# Patient Record
Sex: Female | Born: 1970 | Race: Black or African American | Hispanic: No | Smoking: Never smoker
Health system: Southern US, Community
[De-identification: ages and names within clinical notes are randomized; demographics above are authoritative.]

## PROBLEM LIST (undated history)

## (undated) DIAGNOSIS — K635 Polyp of colon: Secondary | ICD-10-CM

## (undated) DIAGNOSIS — E538 Deficiency of other specified B group vitamins: Secondary | ICD-10-CM

## (undated) DIAGNOSIS — I10 Essential (primary) hypertension: Secondary | ICD-10-CM

## (undated) DIAGNOSIS — I509 Heart failure, unspecified: Secondary | ICD-10-CM

## (undated) DIAGNOSIS — E119 Type 2 diabetes mellitus without complications: Secondary | ICD-10-CM

## (undated) DIAGNOSIS — I2510 Atherosclerotic heart disease of native coronary artery without angina pectoris: Secondary | ICD-10-CM

## (undated) DIAGNOSIS — N96 Recurrent pregnancy loss: Secondary | ICD-10-CM

## (undated) DIAGNOSIS — N920 Excessive and frequent menstruation with regular cycle: Secondary | ICD-10-CM

## (undated) DIAGNOSIS — I6529 Occlusion and stenosis of unspecified carotid artery: Secondary | ICD-10-CM

## (undated) HISTORY — PX: CORONARY ARTERY BYPASS GRAFT: SHX141

## (undated) HISTORY — PX: COLONOSCOPY: SHX174

## (undated) HISTORY — PX: ESOPHAGOGASTRODUODENOSCOPY: SHX1529

---

## 2019-10-19 ENCOUNTER — Other Ambulatory Visit: Payer: Self-pay

## 2019-10-19 ENCOUNTER — Other Ambulatory Visit: Payer: Self-pay | Admitting: Gastroenterology

## 2019-10-19 ENCOUNTER — Ambulatory Visit
Admission: RE | Admit: 2019-10-19 | Discharge: 2019-10-19 | Disposition: A | Discharge status: Home / Self Care | Payer: Medicare Other | Attending: Gastroenterology | Admitting: Gastroenterology

## 2019-10-19 ENCOUNTER — Ambulatory Visit
Admission: RE | Admit: 2019-10-19 | Discharge: 2019-10-19 | Disposition: A | Discharge status: Home / Self Care | Payer: Medicare Other | Source: Ambulatory Visit | Attending: Gastroenterology | Admitting: Gastroenterology

## 2019-10-19 DIAGNOSIS — K5909 Other constipation: Secondary | ICD-10-CM | POA: Insufficient documentation

## 2020-03-29 ENCOUNTER — Ambulatory Visit: Payer: Medicare Other

## 2020-04-02 ENCOUNTER — Other Ambulatory Visit: Payer: Self-pay

## 2020-04-02 ENCOUNTER — Ambulatory Visit: Payer: Medicare Other | Attending: Obstetrics and Gynecology | Admitting: Physical Therapy

## 2020-04-02 ENCOUNTER — Encounter: Payer: Self-pay | Admitting: Physical Therapy

## 2020-04-02 VITALS — BP 114/72 | HR 102

## 2020-04-02 DIAGNOSIS — R278 Other lack of coordination: Secondary | ICD-10-CM | POA: Diagnosis present

## 2020-04-02 DIAGNOSIS — M6281 Muscle weakness (generalized): Secondary | ICD-10-CM | POA: Insufficient documentation

## 2020-04-02 DIAGNOSIS — R293 Abnormal posture: Secondary | ICD-10-CM | POA: Insufficient documentation

## 2020-04-02 NOTE — Therapy (Signed)
[facility] [address] [place], [place], [address] Phone: [phone]   Fax:  [phone]  Physical Therapy Evaluation  Patient Details  Name: Stephanie Frye MRN: [id number] Date of Birth: 09-01-1971 Referring Provider (PT): Christeen Douglas   Encounter Date: 04/02/2020  PT End of Session - 04/02/20 0809    Visit Number  1    Number of Visits  12    Date for PT Re-Evaluation  06/25/20    Authorization Type  IE 04/02/2020    PT Start Time  0810    PT Stop Time  0900    PT Time Calculation (min)  50 min    Activity Tolerance  Patient tolerated treatment well    Behavior During Therapy  [facility] for tasks assessed/performed       History reviewed. No pertinent past medical history.  History reviewed. No pertinent surgical history.  Vitals:   04/02/20 0827  BP: 114/72  Pulse: (!) 102         [facility] PT Assessment - 04/02/20 0001      Assessment   Medical Diagnosis  chronic constipation    Referring Provider (PT)  Christeen Douglas        PELVIC HEALTH PHYSICAL THERAPY EVALUATION  SCREENING Red Flags: None Have you had any night sweats? Unexplained weight loss? Saddle anesthesia? Unexplained changes in bowel or bladder habits?  Precautions: extensive cardiac hx  SUBJECTIVE  Chief Complaint: Patient notes this constipation issue started in 2016. Patient notes increased constipation since that time. She reports that she has a small pelvis and feels this is part of the problem as well as her caridac hx and medications she takes to maintain her health/wellbeing. She reports straining with BMs despite an urge to empty. She denies any issues or urinary concerns. Patient notes some nervousness about her appointment today as she is unsure of what pelvic floor physical therapy involves.  Pertinent History:  Falls Positive for fall. Ambulates with SPC typically because of feelings of unsteadiness. Scoliosis Negative. Pulmonary  disease/dysfunction Negative. Surgical history: Positive for (per MD note: . COLONOSCOPY 2018  hemorrhoids  . 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 PERCUTANEOUS PLACEMENT INTRAVASCULAR STENT CERVICAL CAROTID ARTERY 2018  . PERCUTANEOUS TRANSCATH PLACEMENT INTRAVASCULAR STENT LEG 2018  . UPPER GASTROINTESTINAL ENDOSCOPY 2018)  Recent Procedures/Tests/Findings: imaging of abdomen showed the following: "1. No suggestion of obstruction or perforation. 2. Nonspecific air-fluid levels in the colon."  Obstetrical History: G8P2 (x1 lost at 3 days, x1 lost at 6 months) 2009, 2011 Deliveries: vaginal Tearing/Episiotomy: x2 episiotomy; pushing for 20 hours Birthing position: back  Gynecological History: Hysterectomy: No Vaginal/Abdominal Endometriosis: Negative Pain with exam: No   Urinary History: Incontinence: Negative. Nocturia: 2x/night Frequency of urination: every 2 hours Pain with urination: Negative Difficulty initiating urination: Negative Frequent UTI: Negative.   Gastrointestinal History: Bristol Stool Chart: Type 1 and 7 Frequency of BMs: 1x/day with medication Pain with defecation: Positive for prior to BM Straining with defecation: Positive Incontinence: Negative. Patient notes good consumption of fruits, vegetables, and fibers to assist in her BM.  Sexual activity/pain: Pain with intercourse: Positive.   Initial penetration: Yes  Deep thrustingYes   Location of pain: no pain Current pain:  0/10    Patient assessment of present state: "It feels like gas"  Patient Goals:  Relieve constipation; strengthen hips and glutes   OBJECTIVE  Mental Status Patient is oriented  to person, place and time.  Recent memory is intact.  Remote memory is intact.  Attention span and concentration are intact.  Expressive speech is intact.  Patient's fund of knowledge is within normal limits for educational  level.  POSTURE/OBSERVATIONS:  Lumbar lordosis: WNL Rounded thoracic spine and forward shoulders commensurate with extensive sternal surgical hx.    GAIT: Grossly within functional limits. Trendelenburg R: Positive for mild L: Positive for mild  RANGE OF MOTION: deferred 2/2 to time constraints   LEFT RIGHT  Lumbar forward flexion (65):      Lumbar extension (30):     Lumbar lateral flexion (25):     Thoracic and Lumbar rotation (30 degrees):       Hip Flexion (0-125):      Hip IR (0-45):     Hip ER (0-45):     Hip Abduction (0-40):     Hip extension (0-15):       SENSATION: Grossly intact to light touch bilateral LEs as determined by testing dermatomes L2-S2 Proprioception and hot/cold testing deferred on this date  STRENGTH: MMT deferred 2/2 to time constraints  RLE LLE  Hip Flexion    Hip Extension    Hip Abduction     Hip Adduction     Hip ER     Hip IR     Knee Extension    Knee Flexion    Dorsiflexion     Plantarflexion (seated)     ABDOMINAL: deferred 2/2 to time constraints Palpation: Diastasis: Scar mobility: Rib flare:  SPECIAL TESTS: deferred 2/2 to time constraints SLR (SN 92, -LR 0.29):  Lumbar quadrant (SN 70):  FABER (SN 81):  FADIR (SN 94):  Stork/March (SP 93):   PHYSICAL PERFORMANCE MEASURES:  STS: WFL   EXTERNAL PELVIC EXAM:  deferred 2/2 to time constraints Palpation: Breath coordination: Cued Lengthen: Cued Contraction: Cough:  INTERNAL VAGINAL EXAM: deferred 2/2 to time constraints Introitus Appears:  Skin integrity:  Scar mobility: Strength (PERF):  Symmetry: Palpation: Prolapse:   INTERNAL RECTAL EXAM: deferred 2/2 to time constraints Strength (PERF): Symmetry: Palpation: Prolapse:   OUTCOME MEASURES: FOTO (Bowel Constipation: 30)   ASSESSMENT Patient is a 49 year old presenting to clinic with chief complaints of persistent constipation and straining with BM. Upon examination, patient demonstrates deficits  in PFM extensibility, PFM coordination, posture, balance as evidenced by reliance on manually assisted BMs, Type 1 and Type 7 stools (Bristol Stool Chart) SPC for gait/balance, and increased thoracic kyphosis. Patient's responses on FOTO outcome measures (30) indicate moderate functional limitations/disability/distress. Patient's progress may be limited due to comorbidities; however, patient's motivation is advantageous. Patient was able to achieve toileting posture for decreased straining during today's evaluation and responded positively to educational interventions. Patient will benefit from continued skilled therapeutic intervention to address deficits in PFM extensibility, PFM coordination, posture, balance in order to increase function and improve overall QOL.  EDUCATION Patient educated on prognosis, POC, and provided with HEP including: toileting posture and bowel retraining basics. Patient articulated understanding and returned demonstration. Patient will benefit from further education in order to maximize compliance and understanding for long-term therapeutic gains.  TREATMENT Neuromuscular Re-education: Patient educated on primary functions of the pelvic floor including: posture/balance, sexual pleasure, storage and elimination of waste from the body, abdominal cavity closure, and breath coordination. Patient educated on toileting posture for improved relaxation of puborectalis mm.      Objective measurements completed on examination: See above findings.        PT  Long Term Goals - 04/02/20 0816      PT LONG TERM GOAL #1   Title  Patient will demonstrate independence with HEP in order to maximize therapeutic gains and improve carryover from physical therapy sessions to ADLs in the home and community.    Baseline  IE: not demonstrated    Time  12    Period  Weeks    Status  New    Target Date  06/25/20      PT LONG TERM GOAL #2   Title  Patient will demonstrate independent  and coordinated diaphragmatic breathing in supine with a 1:2 breathing pattern for improved down-regulation of the nervous system and improved management of intra-abdominal pressures in order to increase function at home and in the community.    Baseline  IE: not demonstrated    Time  12    Period  Weeks    Status  New    Target Date  06/25/20      PT LONG TERM GOAL #3   Title  Patient will demonstrate improved function as evidenced by a score of 44 on FOTO measure for full participation in activities at home and in the community.    Baseline  IE: 30    Time  12    Period  Weeks    Status  New    Target Date  06/25/20      PT LONG TERM GOAL #4   Title  Patient will report BMs classified as Type 3-Type 4 on the [facility] Stool Chart greater than 50% of the time to demonstrate improved motility and stool bulking in order to decrease fecal distress and improve overall QOL.    Baseline  IE: Type 1 &7    Time  12    Period  Weeks    Status  New    Target Date  06/25/20      PT LONG TERM GOAL #5   Title  Patient will demonstrate improved toileting posture with knees higher than hips and feet supported, and will report straining <2x/week with bowel movements in order to decrease incidence of constipation/hemorrhoids/mismanagement of intra-abdominal pressure and improve overall QOL.    Baseline  IE: not demonstrated    Time  12    Period  Weeks    Status  New    Target Date  06/25/20             Plan - 04/02/20 0809    Clinical Impression Statement  Patient is a 49 year old presenting to clinic with chief complaints of persistent constipation and straining with BM. Upon examination, patient demonstrates deficits in PFM extensibility, PFM coordination, posture, balance as evidenced by reliance on manually assisted BMs, Type 1 and Type 7 stools (Bristol Stool Chart) SPC for gait/balance, and increased thoracic kyphosis. Patient's responses on FOTO outcome measures (30) indicate moderate  functional limitations/disability/distress. Patient's progress may be limited due to comorbidities; however, patient's motivation is advantageous. Patient was able to achieve toileting posture for decreased straining during today's evaluation and responded positively to educational interventions. Patient will benefit from continued skilled therapeutic intervention to address deficits in PFM extensibility, PFM coordination, posture, balance in order to increase function and improve overall QOL.    Personal Factors and Comorbidities  Comorbidity 3+;Age;Behavior Pattern;Past/Current Experience;Time since onset of injury/illness/exacerbation;Social Background;Fitness    Comorbidities  chronic anemia, CHF, hx of MI, hx of CABG, DM, HTN, hyperipidemia    Examination-Activity Limitations  Lift;Squat;Carry;Continence;Toileting;Transfers;Sit;Stairs    Examination-Participation  Restrictions  Cleaning;Interpersonal Relationship;Laundry;Shop;Driving;Community Activity;Yard Work    Conservation officer, historic buildings  Evolving/Moderate complexity    Clinical Decision Making  Moderate    Rehab Potential  Fair    PT Frequency  1x / week    PT Duration  12 weeks    PT Treatment/Interventions  Cryotherapy;Moist Heat;Therapeutic activities;Neuromuscular re-education;Functional mobility training;Stair training;Gait training;Therapeutic exercise;Balance training;Scar mobilization;Taping;Dry needling;Joint Manipulations;Spinal Manipulations;Manual techniques;Patient/family education;Passive range of motion    PT Next Visit Plan  PFM physical assessment; IAP basics    PT Home Exercise Plan  toileting posture; bowel retraining    Consulted and Agree with Plan of Care  Patient       Patient will benefit from skilled therapeutic intervention in order to improve the following deficits and impairments:  Abnormal gait, Decreased balance, Decreased endurance, Decreased mobility, Difficulty walking, Hypomobility, Improper body  mechanics, Pain, Postural dysfunction, Impaired flexibility, Decreased strength, Decreased coordination, Decreased activity tolerance, Decreased range of motion, Decreased scar mobility, Increased fascial restricitons  Visit Diagnosis: Other lack of coordination - Plan: PT plan of care cert/re-cert  Muscle weakness (generalized) - Plan: PT plan of care cert/re-cert  Abnormal posture - Plan: PT plan of care cert/re-cert     Problem List There are no problems to display for this patient.  Sheria Lang PT, DPT [phone] 04/02/2020, 11:33 AM  [facility] [address] [place], [place], [address] Phone: [phone]   Fax:  [phone]  Name: Stephanie Frye MRN: [id number] Date of Birth: Jan 28, 1971

## 2020-04-12 ENCOUNTER — Ambulatory Visit: Payer: Medicare Other | Admitting: Physical Therapy

## 2020-04-12 ENCOUNTER — Other Ambulatory Visit: Payer: Self-pay

## 2020-04-12 ENCOUNTER — Encounter: Payer: Self-pay | Admitting: Physical Therapy

## 2020-04-12 DIAGNOSIS — R278 Other lack of coordination: Secondary | ICD-10-CM

## 2020-04-12 DIAGNOSIS — M6281 Muscle weakness (generalized): Secondary | ICD-10-CM

## 2020-04-12 DIAGNOSIS — R293 Abnormal posture: Secondary | ICD-10-CM

## 2020-04-12 NOTE — Therapy (Signed)
[facility] [address] [place], [place], [address] Phone: [phone]   Fax:  [phone]  Physical Therapy Treatment  Patient Details  Name: Stephanie Frye MRN: [id number] Date of Birth: 1971/11/23 Referring Provider (PT): Benjaman Kindler   Encounter Date: 04/12/2020  PT End of Session - 04/12/20 0819    Visit Number  2    Number of Visits  12    Date for PT Re-Evaluation  06/25/20    Authorization Type  IE 04/02/2020    PT Start Time  0814    PT Stop Time  0857    PT Time Calculation (min)  43 min    Activity Tolerance  Patient tolerated treatment well    Behavior During Therapy  [facility] for tasks assessed/performed       History reviewed. No pertinent past medical history.  History reviewed. No pertinent surgical history.  There were no vitals filed for this visit.  Subjective Assessment - 04/12/20 0815    Subjective  Patient reports no significant changes since her last session. She does note that using a stool under her feet for improved toileting posture enabled easier bowel movements. She reports not having to strian as much.    Currently in Pain?  No/denies      TREATMENT  Manual Therapy: STM performed to colon to allow for improved gut motility and decreased straining with bowel movements Patient returned demonstration to ensure understanding of HEP.  Neuromuscular Re-education: Patient educated extensively on typical bowel function, toileting posture, and bowel retraining in order to better regulate bowel through behavioral changes.  Patient educated on colon anatomy for improved understanding of colon massage for bowel stimulation. Supine hooklying diaphragmatic breathing with VCs and TCs for downregulation of the nervous system and improved management of IAP   Patient educated throughout session on appropriate technique and form using multi-modal cueing, HEP, and activity modification. Patient articulated  understanding and returned demonstration.  Patient Response to interventions: Patient eager to apply bowel retraining strategies.  ASSESSMENT Patient presents to clinic with excellent motivation to participate in therapy. Patient demonstrates deficits in PFM extensibility, PFM coordination, posture, balance. Patient able to achieve borborygmos with colon massage and diaphragmatic breath during today's session and responded positively to educational interventions. Patient will benefit from continued skilled therapeutic intervention to address remaining deficits in PFM extensibility, PFM coordination, posture, balance in order to increase function and improve overall QOL.   PT Long Term Goals - 04/02/20 0816      PT LONG TERM GOAL #1   Title  Patient will demonstrate independence with HEP in order to maximize therapeutic gains and improve carryover from physical therapy sessions to ADLs in the home and community.    Baseline  IE: not demonstrated    Time  12    Period  Weeks    Status  New    Target Date  06/25/20      PT LONG TERM GOAL #2   Title  Patient will demonstrate independent and coordinated diaphragmatic breathing in supine with a 1:2 breathing pattern for improved down-regulation of the nervous system and improved management of intra-abdominal pressures in order to increase function at home and in the community.    Baseline  IE: not demonstrated    Time  12    Period  Weeks    Status  New    Target Date  06/25/20      PT LONG TERM GOAL #3   Title  Patient will demonstrate improved function as evidenced by a score of 44 on FOTO measure for full participation in activities at home and in the community.    Baseline  IE: 30    Time  12    Period  Weeks    Status  New    Target Date  06/25/20      PT LONG TERM GOAL #4   Title  Patient will report BMs classified as Type 3-Type 4 on the [facility] Stool Chart greater than 50% of the time to demonstrate improved motility and stool  bulking in order to decrease fecal distress and improve overall QOL.    Baseline  IE: Type 1 &7    Time  12    Period  Weeks    Status  New    Target Date  06/25/20      PT LONG TERM GOAL #5   Title  Patient will demonstrate improved toileting posture with knees higher than hips and feet supported, and will report straining <2x/week with bowel movements in order to decrease incidence of constipation/hemorrhoids/mismanagement of intra-abdominal pressure and improve overall QOL.    Baseline  IE: not demonstrated    Time  12    Period  Weeks    Status  New    Target Date  06/25/20            Plan - 04/12/20 1004    Clinical Impression Statement  Patient presents to clinic with excellent motivation to participate in therapy. Patient demonstrates deficits in PFM extensibility, PFM coordination, posture, balance. Patient able to achieve borborygmos with colon massage and diaphragmatic breath during today's session and responded positively to educational interventions. Patient will benefit from continued skilled therapeutic intervention to address remaining deficits in PFM extensibility, PFM coordination, posture, balance in order to increase function and improve overall QOL.    Personal Factors and Comorbidities  Comorbidity 3+;Age;Behavior Pattern;Past/Current Experience;Time since onset of injury/illness/exacerbation;Social Background;Fitness    Comorbidities  chronic anemia, CHF, hx of MI, hx of CABG, DM, HTN, hyperipidemia    Examination-Activity Limitations  Lift;Squat;Carry;Continence;Toileting;Transfers;Sit;Stairs    Examination-Participation Restrictions  Cleaning;Interpersonal Relationship;Laundry;Shop;Driving;Community Activity;Yard Work    Conservation officer, historic buildings  Evolving/Moderate complexity    Rehab Potential  Fair    PT Frequency  1x / week    PT Duration  12 weeks    PT Treatment/Interventions  Cryotherapy;Moist Heat;Therapeutic activities;Neuromuscular  re-education;Functional mobility training;Stair training;Gait training;Therapeutic exercise;Balance training;Scar mobilization;Taping;Dry needling;Joint Manipulations;Spinal Manipulations;Manual techniques;Patient/family education;Passive range of motion    PT Next Visit Plan  IAP basics    PT Home Exercise Plan  toileting posture; bowel retraining    Consulted and Agree with Plan of Care  Patient       Patient will benefit from skilled therapeutic intervention in order to improve the following deficits and impairments:  Abnormal gait, Decreased balance, Decreased endurance, Decreased mobility, Difficulty walking, Hypomobility, Improper body mechanics, Pain, Postural dysfunction, Impaired flexibility, Decreased strength, Decreased coordination, Decreased activity tolerance, Decreased range of motion, Decreased scar mobility, Increased fascial restricitons  Visit Diagnosis: Other lack of coordination  Muscle weakness (generalized)  Abnormal posture     Problem List There are no problems to display for this patient.  Sheria Lang PT, DPT [phone] 04/12/2020, 10:15 AM   [facility] [address] [place], [place], [address] Phone: [phone]   Fax:  [phone]  Name: Stephanie Frye MRN: [id number] Date of Birth: 06-23-1971

## 2020-04-16 ENCOUNTER — Ambulatory Visit: Payer: Medicare Other | Attending: Obstetrics and Gynecology | Admitting: Physical Therapy

## 2020-04-16 DIAGNOSIS — R278 Other lack of coordination: Secondary | ICD-10-CM | POA: Insufficient documentation

## 2020-04-16 DIAGNOSIS — R293 Abnormal posture: Secondary | ICD-10-CM | POA: Insufficient documentation

## 2020-04-16 DIAGNOSIS — M6281 Muscle weakness (generalized): Secondary | ICD-10-CM | POA: Insufficient documentation

## 2020-04-19 ENCOUNTER — Ambulatory Visit: Payer: Medicare Other | Admitting: Physical Therapy

## 2020-04-23 ENCOUNTER — Ambulatory Visit: Payer: Medicare Other | Admitting: Physical Therapy

## 2020-04-23 ENCOUNTER — Encounter: Payer: Self-pay | Admitting: Physical Therapy

## 2020-04-23 ENCOUNTER — Other Ambulatory Visit: Payer: Self-pay

## 2020-04-23 DIAGNOSIS — R278 Other lack of coordination: Secondary | ICD-10-CM | POA: Diagnosis present

## 2020-04-23 DIAGNOSIS — R293 Abnormal posture: Secondary | ICD-10-CM

## 2020-04-23 DIAGNOSIS — M6281 Muscle weakness (generalized): Secondary | ICD-10-CM

## 2020-04-23 NOTE — Therapy (Signed)
[facility] [address] [place], [place], [address] Phone: [phone]   Fax:  [phone]  Physical Therapy Treatment  Patient Details  Name: Stephanie Frye MRN: [id number] Date of Birth: [birth year] Referring Provider (PT): Christeen Douglas   Encounter Date: 04/23/2020  PT End of Session - 04/23/20 0858    Visit Number  3    Number of Visits  12    Date for PT Re-Evaluation  06/25/20    Authorization Type  IE 04/02/2020    PT Start Time  0857    PT Stop Time  0955    PT Time Calculation (min)  58 min    Activity Tolerance  Patient tolerated treatment well    Behavior During Therapy  [facility] for tasks assessed/performed       History reviewed. No pertinent past medical history.  History reviewed. No pertinent surgical history.  There were no vitals filed for this visit.  Subjective Assessment - 04/23/20 0859    Subjective  Patient notes that she has been good. Patient reports that she has been consistent with her HEP and she feels her bowel is getting more regulated. Patient denies having to strain, but notes the BMs are in "slow motion." Patient would like to be able to empty before bed for improved sleep.    Currently in Pain?  No/denies      TREATMENT Neuromuscular Re-education: Patient educated extensively on IAP and diaphragm function for improved bowel regularity and decreased straining with BM.  Supine hooklying diaphragmatic breathing with VCs and TCs for downregulation of the nervous system and improved management of IAP Supine knee to chest with PFM lengthening, BLE, for improved PFM release Supine double knee to chest with PFM lengthening, BLE, for improved PFM release Supine hooklying trunk rotations for decreased tension in the abdominals and PFM   Patient educated throughout session on appropriate technique and form using multi-modal cueing, HEP, and activity modification. Patient articulated understanding and  returned demonstration.  Patient Response to interventions: Patient eager to apply bowel retraining strategies.  ASSESSMENT Patient presents to clinic with excellent motivation to participate in therapy. Patient demonstrates deficits in PFM extensibility, PFM coordination, posture, balance. Patient articulating excellent strategies for bowel movement management during today's session and responded positively to educational interventions. Patient will benefit from continued skilled therapeutic intervention to address remaining deficits in PFM extensibility, PFM coordination, posture, balance in order to increase function and improve overall QOL.    PT Long Term Goals - 04/02/20 0816      PT LONG TERM GOAL #1   Title  Patient will demonstrate independence with HEP in order to maximize therapeutic gains and improve carryover from physical therapy sessions to ADLs in the home and community.    Baseline  IE: not demonstrated    Time  12    Period  Weeks    Status  New    Target Date  06/25/20      PT LONG TERM GOAL #2   Title  Patient will demonstrate independent and coordinated diaphragmatic breathing in supine with a 1:2 breathing pattern for improved down-regulation of the nervous system and improved management of intra-abdominal pressures in order to increase function at home and in the community.    Baseline  IE: not demonstrated    Time  12    Period  Weeks    Status  New    Target Date  06/25/20      PT LONG TERM  GOAL #3   Title  Patient will demonstrate improved function as evidenced by a score of 44 on FOTO measure for full participation in activities at home and in the community.    Baseline  IE: 30    Time  12    Period  Weeks    Status  New    Target Date  06/25/20      PT LONG TERM GOAL #4   Title  Patient will report BMs classified as Type 3-Type 4 on the [facility] Stool Chart greater than 50% of the time to demonstrate improved motility and stool bulking in order to  decrease fecal distress and improve overall QOL.    Baseline  IE: Type 1 &7    Time  12    Period  Weeks    Status  New    Target Date  06/25/20      PT LONG TERM GOAL #5   Title  Patient will demonstrate improved toileting posture with knees higher than hips and feet supported, and will report straining <2x/week with bowel movements in order to decrease incidence of constipation/hemorrhoids/mismanagement of intra-abdominal pressure and improve overall QOL.    Baseline  IE: not demonstrated    Time  12    Period  Weeks    Status  New    Target Date  06/25/20            Plan - 04/23/20 0859    Clinical Impression Statement  Patient presents to clinic with excellent motivation to participate in therapy. Patient demonstrates deficits in PFM extensibility, PFM coordination, posture, balance. Patient articulating excellent strategies for bowel movement management during today's session and responded positively to educational interventions. Patient will benefit from continued skilled therapeutic intervention to address remaining deficits in PFM extensibility, PFM coordination, posture, balance in order to increase function and improve overall QOL.    Personal Factors and Comorbidities  Comorbidity 3+;Age;Behavior Pattern;Past/Current Experience;Time since onset of injury/illness/exacerbation;Social Background;Fitness    Comorbidities  chronic anemia, CHF, hx of MI, hx of CABG, DM, HTN, hyperipidemia    Examination-Activity Limitations  Lift;Squat;Carry;Continence;Toileting;Transfers;Sit;Stairs    Examination-Participation Restrictions  Cleaning;Interpersonal Relationship;Laundry;Shop;Driving;Community Activity;Yard Work    [employer]chant [employer] officer  Evolving/Moderate complexity    Rehab Potential  Fair    PT Frequency  1x / week    PT Duration  12 weeks    PT Treatment/Interventions  Cryotherapy;Moist Heat;Therapeutic activities;Neuromuscular re-education;Functional mobility  training;Stair training;Gait training;Therapeutic exercise;Balance training;Scar mobilization;Taping;Dry needling;Joint Manipulations;Spinal Manipulations;Manual techniques;Patient/family education;Passive range of motion    PT Next Visit Plan  IAP basics    PT Home Exercise Plan  toileting posture; bowel retraining    Consulted and Agree with Plan of Care  Patient       Patient will benefit from skilled therapeutic intervention in order to improve the following deficits and impairments:  Abnormal gait, Decreased balance, Decreased endurance, Decreased mobility, Difficulty walking, Hypomobility, Improper body mechanics, Pain, Postural dysfunction, Impaired flexibility, Decreased strength, Decreased coordination, Decreased activity tolerance, Decreased range of motion, Decreased scar mobility, Increased fascial restricitons  Visit Diagnosis: Other lack of coordination  Muscle weakness (generalized)  Abnormal posture     Problem List There are no problems to display for this patient.  Myles Gip PT, DPT [phone] 04/23/2020, 10:10 AM  [place] [facility] [address] [place], [place], [address] Phone: [phone]   Fax:  [phone]  Name: Icelynn Onken MRN: [id number] Date of Birth: 06-30-1971

## 2020-04-30 ENCOUNTER — Ambulatory Visit: Payer: Medicare Other | Admitting: Physical Therapy

## 2020-04-30 ENCOUNTER — Other Ambulatory Visit: Payer: Self-pay

## 2020-04-30 ENCOUNTER — Encounter: Payer: Self-pay | Admitting: Physical Therapy

## 2020-04-30 DIAGNOSIS — R293 Abnormal posture: Secondary | ICD-10-CM

## 2020-04-30 DIAGNOSIS — R278 Other lack of coordination: Secondary | ICD-10-CM | POA: Diagnosis not present

## 2020-04-30 DIAGNOSIS — M6281 Muscle weakness (generalized): Secondary | ICD-10-CM

## 2020-04-30 NOTE — Therapy (Signed)
[facility] [address] [place], [place], [address] Phone: [phone]   Fax:  [phone]  Physical Therapy Treatment  Patient Details  Name: Stephanie Frye MRN: [id number] Date of Birth: 22-Aug-1971 Referring Provider (PT): Benjaman Kindler   Encounter Date: 04/30/2020  PT End of Session - 04/30/20 0807    Visit Number  4    Number of Visits  12    Date for PT Re-Evaluation  06/25/20    Authorization Type  IE 04/02/2020    PT Start Time  0803    PT Stop Time  0835    PT Time Calculation (min)  32 min    Activity Tolerance  Patient tolerated treatment well    Behavior During Therapy  [facility] for tasks assessed/performed       History reviewed. No pertinent past medical history.  History reviewed. No pertinent surgical history.  There were no vitals filed for this visit.  Subjective Assessment - 04/30/20 0806    Subjective  Patient notes that she can only participate in therapy for 30 min due to a conflicting obligation that had a last minute schedule change. Patient notes otherwise she is doing wonderful. Patient notes she is being aggravated by allergies. Patient notes that she has been able to have about 2 BMs/day. Patient notes her bowels are back to a comfortable normal.    Currently in Pain?  No/denies       TREATMENT Neuromuscular Re-education: Patient education on bowel regularity and gentle transition to less reliance on stool softeners and laxatives with continued consistent practice of bowel regulation practices at home.  Review of HEP and bowel regulation strategies including: posture, massage, breathwork, and dietary fiber intake.  Patient educated throughout session on appropriate technique and form using multi-modal cueing, HEP, and activity modification. Patient articulated understanding and returned demonstration.  Patient Response to interventions: Patient confirms intent to reach out to prescribing MD regarding  adjustment of linaclotide dosage.  ASSESSMENT Patient presents to clinic with excellent motivation to participate in therapy. Patient demonstrates deficits in PFM extensibility, PFM coordination, posture, balance. Patient articulating improved understanding of bowel regularity factors including: frequency, consistency of stool, and presence of straining during today's session and responded positively to educational interventions. Patient will benefit from continued skilled therapeutic intervention to address remaining deficits in PFM extensibility, PFM coordination, posture, balance in order to increase function and improve overall QOL.   PT Long Term Goals - 04/02/20 0816      PT LONG TERM GOAL #1   Title  Patient will demonstrate independence with HEP in order to maximize therapeutic gains and improve carryover from physical therapy sessions to ADLs in the home and community.    Baseline  IE: not demonstrated    Time  12    Period  Weeks    Status  New    Target Date  06/25/20      PT LONG TERM GOAL #2   Title  Patient will demonstrate independent and coordinated diaphragmatic breathing in supine with a 1:2 breathing pattern for improved down-regulation of the nervous system and improved management of intra-abdominal pressures in order to increase function at home and in the community.    Baseline  IE: not demonstrated    Time  12    Period  Weeks    Status  New    Target Date  06/25/20      PT LONG TERM GOAL #3   Title  Patient will  demonstrate improved function as evidenced by a score of 44 on FOTO measure for full participation in activities at home and in the community.    Baseline  IE: 30    Time  12    Period  Weeks    Status  New    Target Date  06/25/20      PT LONG TERM GOAL #4   Title  Patient will report BMs classified as Type 3-Type 4 on the [facility] Stool Chart greater than 50% of the time to demonstrate improved motility and stool bulking in order to decrease fecal  distress and improve overall QOL.    Baseline  IE: Type 1 &7    Time  12    Period  Weeks    Status  New    Target Date  06/25/20      PT LONG TERM GOAL #5   Title  Patient will demonstrate improved toileting posture with knees higher than hips and feet supported, and will report straining <2x/week with bowel movements in order to decrease incidence of constipation/hemorrhoids/mismanagement of intra-abdominal pressure and improve overall QOL.    Baseline  IE: not demonstrated    Time  12    Period  Weeks    Status  New    Target Date  06/25/20            Plan - 04/30/20 0808    Clinical Impression Statement  Patient presents to clinic with excellent motivation to participate in therapy. Patient demonstrates deficits in PFM extensibility, PFM coordination, posture, balance. Patient articulating improved understanding of bowel regularity factors including: frequency, consistency of stool, and presence of straining during today's session and responded positively to educational interventions. Patient will benefit from continued skilled therapeutic intervention to address remaining deficits in PFM extensibility, PFM coordination, posture, balance in order to increase function and improve overall QOL.    Personal Factors and Comorbidities  Comorbidity 3+;Age;Behavior Pattern;Past/Current Experience;Time since onset of injury/illness/exacerbation;Social Background;Fitness    Comorbidities  chronic anemia, CHF, hx of MI, hx of CABG, DM, HTN, hyperipidemia    Examination-Activity Limitations  Lift;Squat;Carry;Continence;Toileting;Transfers;Sit;Stairs    Examination-Participation Restrictions  Cleaning;Interpersonal Relationship;Laundry;Shop;Driving;Community Activity;Yard Work    Conservation officer, historic buildings  Evolving/Moderate complexity    Rehab Potential  Fair    PT Frequency  1x / week    PT Duration  12 weeks    PT Treatment/Interventions  Cryotherapy;Moist Heat;Therapeutic  activities;Neuromuscular re-education;Functional mobility training;Stair training;Gait training;Therapeutic exercise;Balance training;Scar mobilization;Taping;Dry needling;Joint Manipulations;Spinal Manipulations;Manual techniques;Patient/family education;Passive range of motion    PT Next Visit Plan  gentle abdominal/low back stretches for improved digestion    PT Home Exercise Plan  toileting posture; bowel retraining    Consulted and Agree with Plan of Care  Patient       Patient will benefit from skilled therapeutic intervention in order to improve the following deficits and impairments:  Abnormal gait, Decreased balance, Decreased endurance, Decreased mobility, Difficulty walking, Hypomobility, Improper body mechanics, Pain, Postural dysfunction, Impaired flexibility, Decreased strength, Decreased coordination, Decreased activity tolerance, Decreased range of motion, Decreased scar mobility, Increased fascial restricitons  Visit Diagnosis: Other lack of coordination  Muscle weakness (generalized)  Abnormal posture     Problem List There are no problems to display for this patient.  Sheria Lang PT, DPT [phone] 04/30/2020, 8:44 AM  [facility] [address] [place], [place], [address] Phone: [phone]   Fax:  [phone]  Name: Edith Groleau MRN: [id number] Date of Birth:  08/01/1971   

## 2020-05-07 ENCOUNTER — Other Ambulatory Visit: Payer: Self-pay

## 2020-05-07 ENCOUNTER — Ambulatory Visit: Payer: Medicare Other | Admitting: Physical Therapy

## 2020-05-07 ENCOUNTER — Encounter: Payer: Self-pay | Admitting: Physical Therapy

## 2020-05-07 DIAGNOSIS — R278 Other lack of coordination: Secondary | ICD-10-CM | POA: Diagnosis not present

## 2020-05-07 DIAGNOSIS — M6281 Muscle weakness (generalized): Secondary | ICD-10-CM

## 2020-05-07 DIAGNOSIS — R293 Abnormal posture: Secondary | ICD-10-CM

## 2020-05-07 NOTE — Therapy (Signed)
[facility] [address] [place], [place], [address] Phone: [phone]   Fax:  [phone]  Physical Therapy Treatment  Patient Details  Name: Stephanie Frye MRN: [id number] Date of Birth: 12-21-1970 Referring Provider (PT): Benjaman Kindler   Encounter Date: 05/07/2020  PT End of Session - 05/07/20 0810    Visit Number  5    Number of Visits  12    Date for PT Re-Evaluation  06/25/20    Authorization Type  IE 04/02/2020    PT Start Time  0802    PT Stop Time  0858    PT Time Calculation (min)  56 min    Activity Tolerance  Patient tolerated treatment well    Behavior During Therapy  [facility] for tasks assessed/performed       History reviewed. No pertinent past medical history.  History reviewed. No pertinent surgical history.  There were no vitals filed for this visit.  Subjective Assessment - 05/07/20 0805    Subjective  Patient states that she has dropped down her linaclotide to 290 mg per day on Monday 04/30/2020. She has continued her bowel retraining practices and is noting continued BMs. Patient adds that she sometimes feels it piling up and with relaxing it will take longer to empty but a larger BM.    Currently in Pain?  No/denies       TREATMENT Neuromuscular Re-education: Patient education on toileting posture and PFM anatomy for improved understanding of relaxation techniques and postures to aid in the emptying of bowels. Seated stretches for improved digestion/management of post-prandial discomfort/bloating/gas:  Spinal rotation and reach, B  Side stretch, B  Spinal extension with UE reach  Figure four stretch with trunk flexion, B  Patient educated throughout session on appropriate technique and form using multi-modal cueing, HEP, and activity modification. Patient articulated understanding and returned demonstration.  Patient Response to interventions: Patient comfortable with transitioning to every other  week.  ASSESSMENT Patient presents to clinic with excellent motivation to participate in therapy. Patient demonstrates deficits in PFM extensibility, PFM coordination, posture, balance. Patient able to perform all seated stretches with good form during today's session and responded positively to educational interventions. Patient will benefit from continued skilled therapeutic intervention to address remaining deficits in PFM extensibility, PFM coordination, posture, balance in order to increase function and improve overall QOL.     PT Long Term Goals - 04/02/20 0816      PT LONG TERM GOAL #1   Title  Patient will demonstrate independence with HEP in order to maximize therapeutic gains and improve carryover from physical therapy sessions to ADLs in the home and community.    Baseline  IE: not demonstrated    Time  12    Period  Weeks    Status  New    Target Date  06/25/20      PT LONG TERM GOAL #2   Title  Patient will demonstrate independent and coordinated diaphragmatic breathing in supine with a 1:2 breathing pattern for improved down-regulation of the nervous system and improved management of intra-abdominal pressures in order to increase function at home and in the community.    Baseline  IE: not demonstrated    Time  12    Period  Weeks    Status  New    Target Date  06/25/20      PT LONG TERM GOAL #3   Title  Patient will demonstrate improved function as evidenced by a score  of 44 on FOTO measure for full participation in activities at home and in the community.    Baseline  IE: 30    Time  12    Period  Weeks    Status  New    Target Date  06/25/20      PT LONG TERM GOAL #4   Title  Patient will report BMs classified as Type 3-Type 4 on the [facility] Stool Chart greater than 50% of the time to demonstrate improved motility and stool bulking in order to decrease fecal distress and improve overall QOL.    Baseline  IE: Type 1 &7    Time  12    Period  Weeks    Status  New     Target Date  06/25/20      PT LONG TERM GOAL #5   Title  Patient will demonstrate improved toileting posture with knees higher than hips and feet supported, and will report straining <2x/week with bowel movements in order to decrease incidence of constipation/hemorrhoids/mismanagement of intra-abdominal pressure and improve overall QOL.    Baseline  IE: not demonstrated    Time  12    Period  Weeks    Status  New    Target Date  06/25/20            Plan - 05/07/20 0817    Clinical Impression Statement  Patient presents to clinic with excellent motivation to participate in therapy. Patient demonstrates deficits in PFM extensibility, PFM coordination, posture, balance. Patient able to perform all seated stretches with good form during today's session and responded positively to educational interventions. Patient will benefit from continued skilled therapeutic intervention to address remaining deficits in PFM extensibility, PFM coordination, posture, balance in order to increase function and improve overall QOL.    Personal Factors and Comorbidities  Comorbidity 3+;Age;Behavior Pattern;Past/Current Experience;Time since onset of injury/illness/exacerbation;Social Background;Fitness    Comorbidities  chronic anemia, CHF, hx of MI, hx of CABG, DM, HTN, hyperipidemia    Examination-Activity Limitations  Lift;Squat;Carry;Continence;Toileting;Transfers;Sit;Stairs    Examination-Participation Restrictions  Cleaning;Interpersonal Relationship;Laundry;Shop;Driving;Community Activity;Yard Work    Conservation officer, historic buildings  Evolving/Moderate complexity    Rehab Potential  Fair    PT Frequency  1x / week    PT Duration  12 weeks    PT Treatment/Interventions  Cryotherapy;Moist Heat;Therapeutic activities;Neuromuscular re-education;Functional mobility training;Stair training;Gait training;Therapeutic exercise;Balance training;Scar mobilization;Taping;Dry needling;Joint Manipulations;Spinal  Manipulations;Manual techniques;Patient/family education;Passive range of motion    PT Next Visit Plan  gentle abdominal/low back stretches for improved digestion    PT Home Exercise Plan  toileting posture; bowel retraining    Consulted and Agree with Plan of Care  Patient       Patient will benefit from skilled therapeutic intervention in order to improve the following deficits and impairments:  Abnormal gait, Decreased balance, Decreased endurance, Decreased mobility, Difficulty walking, Hypomobility, Improper body mechanics, Pain, Postural dysfunction, Impaired flexibility, Decreased strength, Decreased coordination, Decreased activity tolerance, Decreased range of motion, Decreased scar mobility, Increased fascial restricitons  Visit Diagnosis: Other lack of coordination  Muscle weakness (generalized)  Abnormal posture     Problem List There are no problems to display for this patient.  Sheria Lang PT, DPT [phone] 05/07/2020, 12:49 PM  [place] [facility] [address] [place], [place], [address] Phone: [phone]   Fax:  [phone]  Name: Stephanie Frye MRN: [id number] Date of Birth: [birth year]

## 2020-05-15 ENCOUNTER — Ambulatory Visit: Payer: Medicare Other | Admitting: Physical Therapy

## 2020-05-31 ENCOUNTER — Encounter: Payer: Medicare Other | Admitting: Physical Therapy

## 2020-06-14 ENCOUNTER — Ambulatory Visit: Payer: Medicare Other | Attending: Obstetrics and Gynecology | Admitting: Physical Therapy

## 2020-06-14 DIAGNOSIS — R293 Abnormal posture: Secondary | ICD-10-CM | POA: Insufficient documentation

## 2020-06-14 DIAGNOSIS — M6281 Muscle weakness (generalized): Secondary | ICD-10-CM | POA: Insufficient documentation

## 2020-06-14 DIAGNOSIS — R278 Other lack of coordination: Secondary | ICD-10-CM | POA: Insufficient documentation

## 2020-06-25 ENCOUNTER — Ambulatory Visit: Payer: Medicare Other | Admitting: Physical Therapy

## 2020-06-25 ENCOUNTER — Encounter: Payer: Self-pay | Admitting: Physical Therapy

## 2020-06-25 ENCOUNTER — Other Ambulatory Visit: Payer: Self-pay

## 2020-06-25 DIAGNOSIS — R293 Abnormal posture: Secondary | ICD-10-CM | POA: Diagnosis present

## 2020-06-25 DIAGNOSIS — R278 Other lack of coordination: Secondary | ICD-10-CM

## 2020-06-25 DIAGNOSIS — M6281 Muscle weakness (generalized): Secondary | ICD-10-CM

## 2020-06-25 NOTE — Therapy (Signed)
[facility] [address] [place], [place], [address] Phone: [phone]   Fax:  [phone]  Physical Therapy Treatment  Patient Details  Name: Stephanie Frye MRN: [id number] Date of Birth: 10/29/1971 Referring Provider (PT): Beasley, Bethany   Encounter Date: 06/25/2020   PT End of Session - 06/25/20 1018    Visit Number 6    Number of Visits 12    Date for PT Re-Evaluation 06/25/20    Authorization Type IE 04/02/2020    PT Start Time 1010    PT Stop Time 1055    PT Time Calculation (min) 45 min    Activity Tolerance Patient tolerated treatment well    Behavior During Therapy WFL for tasks assessed/performed           History reviewed. No pertinent past medical history.  History reviewed. No pertinent surgical history.  There were no vitals filed for this visit.   Subjective Assessment - 06/25/20 1019    Subjective Patient notes that she continues to have good BMs. She has done well with a lower does of linaclotide and has even on occasion missed doses without any incidence of increased constipation. She does still note occasional difficulty with BMs but much less.    Currently in Pain? No/denies           TREATMENT Neuromuscular Re-education: Reassessed goals; see below. Reviewed bowel regulation strategies and patient education on expectations for self-management.  Patient educated throughout session on appropriate technique and form using multi-modal cueing, HEP, and activity modification. Patient articulated understanding and returned demonstration.  Patient Response to interventions: Patient comfortable with d/c  ASSESSMENT Patient presents to clinic with excellent motivation to participate in therapy. Patient demonstrates minimal to no deficits in PFM extensibility, PFM coordination, posture, balance. Patient has met or exceeded all goals set forth for physical therapy and is appropriate for discharge.    PT  Long Term Goals - 06/25/20 1025      PT LONG TERM GOAL #1   Title Patient will demonstrate independence with HEP in order to maximize therapeutic gains and improve carryover from physical therapy sessions to ADLs in the home and community.    Baseline IE: not demonstrated 7/12: IND    Time 12    Period Weeks    Status Achieved      PT LONG TERM GOAL #2   Title Patient will demonstrate independent and coordinated diaphragmatic breathing in supine with a 1:2 breathing pattern for improved down-regulation of the nervous system and improved management of intra-abdominal pressures in order to increase function at home and in the community.    Baseline IE: not demonstrated; 7/12: IND    Time 12    Period Weeks    Status Achieved      PT LONG TERM GOAL #3   Title Patient will demonstrate improved function as evidenced by a score of 44 on FOTO measure for full participation in activities at home and in the community.    Baseline IE: 30; 7/12: 52    Time 12    Period Weeks    Status Achieved    Target Date --      PT LONG TERM GOAL #4   Title Patient will report BMs classified as Type 3-Type 4 on the [place] Stool Chart greater than 50% of the time to demonstrate improved motility and stool bulking in order to decrease fecal distress and improve overall QOL.    Baseline IE: Type 1 &  7; 7/12: Type 3/4 100%    Time 12    Period Weeks    Status Achieved    Target Date --      PT LONG TERM GOAL #5   Title Patient will demonstrate improved toileting posture with knees higher than hips and feet supported, and will report straining <2x/week with bowel movements in order to decrease incidence of constipation/hemorrhoids/mismanagement of intra-abdominal pressure and improve overall QOL.    Baseline IE: not demonstrated; 7/12: 0x straining/week    Time 8    Period Weeks    Status Achieved    Target Date --                 Plan - 06/25/20 1024    Clinical Impression Statement Patient  presents to clinic with excellent motivation to participate in therapy. Patient demonstrates minimal to no deficits in PFM extensibility, PFM coordination, posture, balance. Patient has met or exceeded all goals set forth for physical therapy and is appropriate for discharge.    Personal Factors and Comorbidities Comorbidity 3+;Age;Behavior Pattern;Past/Current Experience;Time since onset of injury/illness/exacerbation;Social Background;Fitness    Comorbidities chronic anemia, CHF, hx of MI, hx of CABG, DM, HTN, hyperipidemia    Examination-Activity Limitations Lift;Squat;Carry;Continence;Toileting;Transfers;Sit;Stairs    Examination-Participation Restrictions Cleaning;Interpersonal Relationship;Laundry;Shop;Driving;Community Activity;Yard Work    Stability/Clinical Decision Making Evolving/Moderate complexity    Rehab Potential Fair    PT Frequency 1x / week    PT Duration 8 weeks    PT Treatment/Interventions Cryotherapy;Moist Heat;Therapeutic activities;Neuromuscular re-education;Functional mobility training;Stair training;Gait training;Therapeutic exercise;Balance training;Scar mobilization;Taping;Dry needling;Joint Manipulations;Spinal Manipulations;Manual techniques;Patient/family education;Passive range of motion    PT Next Visit Plan gentle abdominal/low back stretches for improved digestion    PT Home Exercise Plan toileting posture; bowel retraining    Consulted and Agree with Plan of Care Patient           Patient will benefit from skilled therapeutic intervention in order to improve the following deficits and impairments:  Abnormal gait, Decreased balance, Decreased endurance, Decreased mobility, Difficulty walking, Hypomobility, Improper body mechanics, Pain, Postural dysfunction, Impaired flexibility, Decreased strength, Decreased coordination, Decreased activity tolerance, Decreased range of motion, Decreased scar mobility, Increased fascial restricitons  Visit Diagnosis: Other  lack of coordination  Muscle weakness (generalized)  Abnormal posture     Problem List There are no problems to display for this patient.  Katlin Harker PT, DPT #[id number] 06/25/2020, 12:42 PM  [facility] [address] [place], [place], [address] Phone: [phone]   Fax:  [phone]  Name: Stephanie Frye MRN: [id number] Date of Birth: 08/31/1971   

## 2020-10-12 ENCOUNTER — Other Ambulatory Visit: Payer: Self-pay

## 2020-10-12 ENCOUNTER — Other Ambulatory Visit
Admission: RE | Admit: 2020-10-12 | Discharge: 2020-10-12 | Disposition: A | Discharge status: Home / Self Care | Payer: Medicare Other | Source: Ambulatory Visit | Attending: Gastroenterology | Admitting: Gastroenterology

## 2020-10-12 DIAGNOSIS — Z20822 Contact with and (suspected) exposure to covid-19: Secondary | ICD-10-CM | POA: Diagnosis not present

## 2020-10-12 DIAGNOSIS — Z01812 Encounter for preprocedural laboratory examination: Secondary | ICD-10-CM | POA: Diagnosis present

## 2020-10-13 LAB — SARS CORONAVIRUS 2 (TAT 6-24 HRS): SARS Coronavirus 2: NEGATIVE

## 2020-10-16 ENCOUNTER — Encounter: Admission: RE | Payer: Self-pay | Source: Home / Self Care

## 2020-10-16 ENCOUNTER — Ambulatory Visit: Admission: RE | Admit: 2020-10-16 | Payer: Medicare Other | Source: Home / Self Care

## 2020-10-16 SURGERY — COLONOSCOPY
Anesthesia: General

## 2020-11-23 ENCOUNTER — Other Ambulatory Visit: Payer: Self-pay

## 2020-11-23 ENCOUNTER — Other Ambulatory Visit
Admission: RE | Admit: 2020-11-23 | Discharge: 2020-11-23 | Disposition: A | Discharge status: Home / Self Care | Payer: Medicare Other | Source: Ambulatory Visit | Attending: Gastroenterology | Admitting: Gastroenterology

## 2020-11-23 DIAGNOSIS — Z20822 Contact with and (suspected) exposure to covid-19: Secondary | ICD-10-CM | POA: Insufficient documentation

## 2020-11-23 DIAGNOSIS — Z01812 Encounter for preprocedural laboratory examination: Secondary | ICD-10-CM | POA: Diagnosis present

## 2020-11-24 LAB — SARS CORONAVIRUS 2 (TAT 6-24 HRS): SARS Coronavirus 2: NEGATIVE

## 2020-11-26 ENCOUNTER — Encounter: Payer: Self-pay | Admitting: *Deleted

## 2020-11-27 ENCOUNTER — Other Ambulatory Visit: Payer: Self-pay

## 2020-11-27 ENCOUNTER — Encounter: Payer: Self-pay | Admitting: *Deleted

## 2020-11-27 ENCOUNTER — Ambulatory Visit: Payer: Medicare Other | Admitting: Anesthesiology

## 2020-11-27 ENCOUNTER — Ambulatory Visit
Admission: RE | Admit: 2020-11-27 | Discharge: 2020-11-27 | Disposition: A | Discharge status: Home / Self Care | Payer: Medicare Other | Attending: Gastroenterology | Admitting: Gastroenterology

## 2020-11-27 ENCOUNTER — Encounter
Admission: RE | Disposition: A | Discharge status: Home / Self Care | Payer: Self-pay | Source: Home / Self Care | Attending: Gastroenterology

## 2020-11-27 DIAGNOSIS — Z86010 Personal history of colonic polyps: Secondary | ICD-10-CM | POA: Diagnosis not present

## 2020-11-27 DIAGNOSIS — Z955 Presence of coronary angioplasty implant and graft: Secondary | ICD-10-CM | POA: Diagnosis not present

## 2020-11-27 DIAGNOSIS — Z951 Presence of aortocoronary bypass graft: Secondary | ICD-10-CM | POA: Diagnosis not present

## 2020-11-27 DIAGNOSIS — K5909 Other constipation: Secondary | ICD-10-CM | POA: Diagnosis not present

## 2020-11-27 DIAGNOSIS — Z7984 Long term (current) use of oral hypoglycemic drugs: Secondary | ICD-10-CM | POA: Diagnosis not present

## 2020-11-27 DIAGNOSIS — Z09 Encounter for follow-up examination after completed treatment for conditions other than malignant neoplasm: Secondary | ICD-10-CM | POA: Diagnosis present

## 2020-11-27 DIAGNOSIS — D649 Anemia, unspecified: Secondary | ICD-10-CM | POA: Diagnosis not present

## 2020-11-27 DIAGNOSIS — Z79899 Other long term (current) drug therapy: Secondary | ICD-10-CM | POA: Diagnosis not present

## 2020-11-27 DIAGNOSIS — Z7902 Long term (current) use of antithrombotics/antiplatelets: Secondary | ICD-10-CM | POA: Diagnosis not present

## 2020-11-27 DIAGNOSIS — K640 First degree hemorrhoids: Secondary | ICD-10-CM | POA: Diagnosis not present

## 2020-11-27 HISTORY — DX: Essential (primary) hypertension: I10

## 2020-11-27 HISTORY — DX: Type 2 diabetes mellitus without complications: E11.9

## 2020-11-27 HISTORY — DX: Recurrent pregnancy loss: N96

## 2020-11-27 HISTORY — DX: Occlusion and stenosis of unspecified carotid artery: I65.29

## 2020-11-27 HISTORY — DX: Polyp of colon: K63.5

## 2020-11-27 HISTORY — DX: Heart failure, unspecified: I50.9

## 2020-11-27 HISTORY — DX: Atherosclerotic heart disease of native coronary artery without angina pectoris: I25.10

## 2020-11-27 HISTORY — DX: Excessive and frequent menstruation with regular cycle: N92.0

## 2020-11-27 HISTORY — PX: COLONOSCOPY: SHX5424

## 2020-11-27 HISTORY — DX: Deficiency of other specified B group vitamins: E53.8

## 2020-11-27 LAB — GLUCOSE, CAPILLARY: Glucose-Capillary: 133 mg/dL — ABNORMAL HIGH (ref 70–99)

## 2020-11-27 SURGERY — COLONOSCOPY
Anesthesia: General

## 2020-11-27 MED ORDER — LIDOCAINE HCL (PF) 1 % IJ SOLN
INTRAMUSCULAR | Status: AC
  Filled 2020-11-27: qty 2

## 2020-11-27 MED ORDER — LIDOCAINE HCL (CARDIAC) PF 100 MG/5ML IV SOSY
PREFILLED_SYRINGE | INTRAVENOUS | Status: DC | PRN
  Administered 2020-11-27: 50 mg via INTRAVENOUS

## 2020-11-27 MED ORDER — PROPOFOL 500 MG/50ML IV EMUL
INTRAVENOUS | Status: DC | PRN
  Administered 2020-11-27: 150 ug/kg/min via INTRAVENOUS

## 2020-11-27 MED ORDER — GLYCOPYRROLATE 0.2 MG/ML IJ SOLN
INTRAMUSCULAR | Status: AC
  Filled 2020-11-27: qty 1

## 2020-11-27 MED ORDER — PHENYLEPHRINE HCL (PRESSORS) 10 MG/ML IV SOLN
INTRAVENOUS | Status: AC
  Filled 2020-11-27: qty 1

## 2020-11-27 MED ORDER — PROPOFOL 10 MG/ML IV BOLUS
INTRAVENOUS | Status: DC | PRN
  Administered 2020-11-27: 60 mg via INTRAVENOUS

## 2020-11-27 MED ORDER — PHENYLEPHRINE HCL (PRESSORS) 10 MG/ML IV SOLN
INTRAVENOUS | Status: DC | PRN
  Administered 2020-11-27 (×2): 100 ug via INTRAVENOUS

## 2020-11-27 MED ORDER — SODIUM CHLORIDE 0.9 % IV SOLN: INTRAVENOUS | Status: DC

## 2020-11-27 MED ORDER — LIDOCAINE HCL (PF) 2 % IJ SOLN
INTRAMUSCULAR | Status: AC
  Filled 2020-11-27: qty 5

## 2020-11-27 MED ORDER — PROPOFOL 500 MG/50ML IV EMUL
INTRAVENOUS | Status: AC
  Filled 2020-11-27: qty 50

## 2020-11-27 NOTE — Progress Notes (Signed)
   11/27/20 0730  Clinical Encounter Type  Visited With Family  Visit Type Initial  Referral From Chaplain  Consult/Referral To Chaplain  While rounding SDS waiting area, chaplain briefly visited with Pt and she said she was wonderful. She was having a colonoscopy. Chaplain said a short prayer for Pt.

## 2020-11-27 NOTE — Anesthesia Preprocedure Evaluation (Signed)
Anesthesia Evaluation  Patient identified by MRN, date of birth, ID band Patient awake    Reviewed: Allergy & Precautions, H&P , NPO status , Patient's Chart, lab work & pertinent test results  History of Anesthesia Complications Negative for: history of anesthetic complications  Airway Mallampati: III  TM Distance: <3 FB Neck ROM: limited    Dental  (+) Chipped   Pulmonary neg pulmonary ROS, neg shortness of breath,    Pulmonary exam normal        Cardiovascular Exercise Tolerance: Good hypertension, (-) angina+ CAD, + Cardiac Stents and +CHF  Normal cardiovascular exam     Neuro/Psych negative neurological ROS  negative psych ROS   GI/Hepatic negative GI ROS, Neg liver ROS,   Endo/Other  diabetes, Type 2  Renal/GU negative Renal ROS  negative genitourinary   Musculoskeletal   Abdominal   Peds  Hematology negative hematology ROS (+)   Anesthesia Other Findings Past Medical History: No date: B12 deficiency No date: CAD (coronary artery disease) No date: Carotid stenosis No date: CHF (congestive heart failure) (HCC) No date: Colon polyps No date: Diabetes (HCC) No date: History of multiple miscarriages No date: HTN (hypertension) No date: Menorrhagia  Past Surgical History: No date: COLONOSCOPY No date: CORONARY ARTERY BYPASS GRAFT No date: ESOPHAGOGASTRODUODENOSCOPY  BMI    Body Mass Index: 34.96 kg/m      Reproductive/Obstetrics negative OB ROS                             Anesthesia Physical Anesthesia Plan  ASA: III  Anesthesia Plan: General   Post-op Pain Management:    Induction: Intravenous  PONV Risk Score and Plan: Propofol infusion and TIVA  Airway Management Planned: Natural Airway and Nasal Cannula  Additional Equipment:   Intra-op Plan:   Post-operative Plan:   Informed Consent: I have reviewed the patients History and Physical, chart, labs and  discussed the procedure including the risks, benefits and alternatives for the proposed anesthesia with the patient or authorized representative who has indicated his/her understanding and acceptance.     Dental Advisory Given  Plan Discussed with: Anesthesiologist, CRNA and Surgeon  Anesthesia Plan Comments: (Patient consented for risks of anesthesia including but not limited to:  - adverse reactions to medications - risk of airway placement if required - damage to eyes, teeth, lips or other oral mucosa - nerve damage due to positioning  - sore throat or hoarseness - Damage to heart, brain, nerves, lungs, other parts of body or loss of life  Patient voiced understanding.)        Anesthesia Quick Evaluation

## 2020-11-27 NOTE — Op Note (Addendum)
[facility] Patient Name: Stephanie Frye Procedure Date: 11/27/2020 7:58 AM MRN: [id number] Account #: [id number] Date of Birth: [birth year] Admit Type: Outpatient Age: 49 Room: [facility] ENDO ROOM 1 Gender: Female Note Status: Supervisor Override Procedure:             Colonoscopy Indications:           High risk colon cancer surveillance: Personal history                         of colonic polyps Providers:             Andrey Farmer MD, MD Medicines:             Monitored Anesthesia Care Complications:         No immediate complications. Procedure:             Pre-Anesthesia Assessment:                        - Prior to the procedure, a History and Physical was                         performed, and patient medications and allergies were                         reviewed. The patient is competent. The risks and                         benefits of the procedure and the sedation options and                         risks were discussed with the patient. All questions                         were answered and informed consent was obtained.                         Patient identification and proposed procedure were                         verified by the physician, the nurse, the anesthetist                         and the technician in the endoscopy suite. Mental                         Status Examination: alert and oriented. Airway                         Examination: normal oropharyngeal airway and neck                         mobility. Respiratory Examination: clear to                         auscultation. CV Examination: normal. Prophylactic                         Antibiotics: The patient does not require prophylactic  antibiotics. Prior Anticoagulants: The patient has                         taken Effient (prasugrel), last dose was 5 days prior                         to procedure. ASA Grade Assessment: III - A patient                          with severe systemic disease. After reviewing the                         risks and benefits, the patient was deemed in                         satisfactory condition to undergo the procedure. The                         anesthesia plan was to use monitored anesthesia care                         (MAC). Immediately prior to administration of                         medications, the patient was re-assessed for adequacy                         to receive sedatives. The heart rate, respiratory                         rate, oxygen saturations, blood pressure, adequacy of                         pulmonary ventilation, and response to care were                         monitored throughout the procedure. The physical                         status of the patient was re-assessed after the                         procedure.                        After obtaining informed consent, the colonoscope was                         passed under direct vision. Throughout the procedure,                         the patient's blood pressure, pulse, and oxygen                         saturations were monitored continuously. The                         Colonoscope was introduced through the anus and  advanced to the the cecum, identified by appendiceal                         orifice and ileocecal valve. The colonoscopy was                         performed without difficulty. The patient tolerated                         the procedure well. The quality of the bowel                         preparation was good. Findings:      The perianal and digital rectal examinations were normal.      The colon (entire examined portion) appeared normal.      Non-bleeding internal hemorrhoids were found during retroflexion. The       hemorrhoids were Grade I (internal hemorrhoids that do not prolapse).      The exam was otherwise without abnormality on direct and retroflexion        views. Impression:            - The entire examined colon is normal.                        - Non-bleeding internal hemorrhoids.                        - The examination was otherwise normal on direct and                         retroflexion views.                        - No specimens collected. Recommendation:        - Discharge patient to home.                        - Resume previous diet.                        - Resume Effient (prasugrel) at prior dose today.                        - Repeat colonoscopy in 10 years for screening                         purposes.                        - Return to referring physician as previously                         scheduled. Procedure Code(s):     --- Professional ---                        J6283, Colorectal cancer screening; colonoscopy on                         individual at high risk Diagnosis Code(s):     --- Professional ---  Z86.010, Personal history of colonic polyps                        K64.0, First degree hemorrhoids CPT copyright 2019 American Medical Association. All rights reserved. The codes documented in this report are preliminary and upon coder review may  be revised to meet current compliance requirements. Andrey Farmer, MD Andrey Farmer MD, MD 11/27/2020 8:55:50 AM Number of Addenda: 0 Note Initiated On: 11/27/2020 7:58 AM Scope Withdrawal Time: 0 hours 9 minutes 41 seconds  Total Procedure Duration: 0 hours 13 minutes 53 seconds  Estimated Blood Loss:  Estimated blood loss: none.      [facility]

## 2020-11-27 NOTE — H&P (Signed)
Outpatient short stay form Pre-procedure 11/27/2020 8:02 AM Stephanie Lot MD, MPH  Primary Physician: Dr. Harrington Challenger  Reason for visit:  Anemia/Constipation/Hx of polyps  History of present illness:   49 y/o lady with chronic constipation and anemia and polyp seen on previous colonoscopy that was not found on follow-up. Takes effient with last dose being over 5 days ago. No family history of GI malignancies. No abdominal surgeries.   No current facility-administered medications for this encounter.  Medications Prior to Admission  Medication Sig Dispense Refill Last Dose  . carvedilol (COREG) 12.5 MG tablet Take 12.5 mg by mouth 2 (two) times daily with a meal.   11/26/2020 at 0630  . amLODipine (NORVASC) 2.5 MG tablet Take 2.5 mg by mouth daily.   11/25/20  . atorvastatin (LIPITOR) 80 MG tablet Take 80 mg by mouth daily.   11/25/20  . dapagliflozin propanediol (FARXIGA) 5 MG TABS tablet Take by mouth daily.   11/25/20  . docusate sodium (COLACE) 100 MG capsule Take 100 mg by mouth 2 (two) times daily.   11/25/20  . glimepiride (AMARYL) 2 MG tablet Take 2 mg by mouth daily with breakfast.   11/25/20  . isosorbide mononitrate (IMDUR) 60 MG 24 hr tablet Take 60 mg by mouth daily.   11/25/20  . lactulose (CHRONULAC) 10 GM/15ML solution Take by mouth 2 (two) times daily.   11/25/2020  . linaclotide (LINZESS) 290 MCG CAPS capsule Take 290 mcg by mouth daily before breakfast.   11/25/2020  . lisinopril-hydrochlorothiazide (ZESTORETIC) 20-25 MG tablet Take 1 tablet by mouth daily.   11/25/2020  . prasugrel (EFFIENT) 10 MG TABS tablet Take 10 mg by mouth daily.   11/22/2020  . sitaGLIPtin-metformin (JANUMET) 50-1000 MG tablet Take 1 tablet by mouth 2 (two) times daily with a meal.   11/25/2020  . vitamin B-12 (CYANOCOBALAMIN) 1000 MCG tablet Take 1,000 mcg by mouth daily.   11/25/2020  . vitamin C (ASCORBIC ACID) 250 MG tablet Take 250 mg by mouth daily.   11/25/2020     Allergies  Allergen  Reactions  . Flu Virus Vaccine      Past Medical History:  Diagnosis Date  . B12 deficiency   . CAD (coronary artery disease)   . Carotid stenosis   . CHF (congestive heart failure) (HCC)   . Colon polyps   . Diabetes (HCC)   . History of multiple miscarriages   . HTN (hypertension)   . Menorrhagia     Review of systems:  Otherwise negative.    Physical Exam  Gen: Alert, oriented. Appears stated age.  HEENT: PERRLA. Lungs: No respiratory distress CV: RRR Abd: soft, benign, no masses Ext: No edema.   Planned procedures: Proceed with colonoscopy. The patient understands the nature of the planned procedure, indications, risks, alternatives and potential complications including but not limited to bleeding, infection, perforation, damage to internal organs and possible oversedation/side effects from anesthesia. The patient agrees and gives consent to proceed.  Please refer to procedure notes for findings, recommendations and patient disposition/instructions.     Stephanie Lot MD, MPH Gastroenterology 11/27/2020  8:02 AM

## 2020-11-27 NOTE — Anesthesia Postprocedure Evaluation (Signed)
Anesthesia Post Note  Patient: Zelpha Messing  Procedure(s) Performed: COLONOSCOPY (N/A )  Patient location during evaluation: Endoscopy Anesthesia Type: General Level of consciousness: awake and alert Pain management: pain level controlled Vital Signs Assessment: post-procedure vital signs reviewed and stable Respiratory status: spontaneous breathing, nonlabored ventilation, respiratory function stable and patient connected to nasal cannula oxygen Cardiovascular status: blood pressure returned to baseline and stable Postop Assessment: no apparent nausea or vomiting Anesthetic complications: no   No complications documented.   Last Vitals:  Vitals:   11/27/20 0910 11/27/20 0920  BP: (!) 165/112 100/74  Pulse: 80   Resp: (!) 21 15  Temp:    SpO2: 100% 100%    Last Pain:  Vitals:   11/27/20 0850  TempSrc: Temporal  PainSc:                  Cleda Mccreedy Jamez Ambrocio

## 2020-11-27 NOTE — Transfer of Care (Signed)
Immediate Anesthesia Transfer of Care Note  Patient: Alaisa Moffitt  Procedure(s) Performed: COLONOSCOPY (N/A )  Patient Location: PACU  Anesthesia Type:General  Level of Consciousness: drowsy  Airway & Oxygen Therapy: Patient Spontanous Breathing  Post-op Assessment: Report given to RN and Post -op Vital signs reviewed and stable  Post vital signs: Reviewed and stable  Last Vitals:  Vitals Value Taken Time  BP 90/43 11/27/20 0855  Temp    Pulse 73 11/27/20 0855  Resp 19 11/27/20 0855  SpO2 99 % 11/27/20 0855  Vitals shown include unvalidated device data.  Last Pain:  Vitals:   11/27/20 0757  TempSrc: Temporal  PainSc: 0-No pain         Complications: No complications documented.

## 2020-11-27 NOTE — Interval H&P Note (Signed)
History and Physical Interval Note:  11/27/2020 8:15 AM  Doris Cheadle  has presented today for surgery, with the diagnosis of PERSONAL HX.OF COLON POLYPS.  The various methods of treatment have been discussed with the patient and family. After consideration of risks, benefits and other options for treatment, the patient has consented to  Procedure(s): COLONOSCOPY (N/A) as a surgical intervention.  The patient's history has been reviewed, patient examined, no change in status, stable for surgery.  I have reviewed the patient's chart and labs.  Questions were answered to the patient's satisfaction.     Regis Bill  Ok to proceed with colonoscopy

## 2020-11-27 NOTE — OR Nursing (Addendum)
Pt had been told repeately that she had to have someone to drive her home.She stated that Malachi Bonds was going to drive her home.She was discharged by wheelchair I   told the volunteer to bring her back upstairs if no one was here in 10 minutes. Once downstairs patient told volunteer that that '' Malachi Bonds '' was going to be 20 more minutes. Volunteer attempted to bring back upstairs but patient got up out of chair and walked away. Called a little later and she was home .

## 2020-11-28 ENCOUNTER — Encounter: Payer: Self-pay | Admitting: Gastroenterology

## 2021-01-16 ENCOUNTER — Other Ambulatory Visit: Payer: Self-pay

## 2021-01-16 ENCOUNTER — Ambulatory Visit (LOCAL_COMMUNITY_HEALTH_CENTER): Payer: Self-pay

## 2021-01-16 DIAGNOSIS — Z0184 Encounter for antibody response examination: Secondary | ICD-10-CM

## 2021-01-16 DIAGNOSIS — Z7185 Encounter for immunization safety counseling: Secondary | ICD-10-CM

## 2021-01-16 NOTE — Progress Notes (Signed)
Requesting info re: MMR vaccine/MMR titer as requirement for college. Reports hx of MMR vaccine as child but does not have documentation. No MMR vaccine in Epic or [facility]. No hx of MMR disease, per pt. Counseled pt that MMR titer will determine her immunity to MMR and if no immunity,  MMR vaccine will be indicated. Pt reports understanding and questions answered.  Pt prefers to have MMR titer drawn today and aware of cost. ROI signed. Escorted to lab by RN and instructed to see clerk for titer payment after lab. Pt in agreement. Josie Saunders, RN

## 2021-01-17 LAB — MEASLES/MUMPS/RUBELLA IMMUNITY
MUMPS ABS, IGG: 231 AU/mL (ref >10.9)
RUBEOLA AB, IGG: >300 AU/mL (ref >16.4)
Rubella Antibodies, IGG: 9.11 index (ref >0.99)

## 2021-05-15 DIAGNOSIS — K5909 Other constipation: Secondary | ICD-10-CM | POA: Diagnosis not present

## 2021-05-15 DIAGNOSIS — D509 Iron deficiency anemia, unspecified: Secondary | ICD-10-CM | POA: Diagnosis not present

## 2021-06-05 DIAGNOSIS — E538 Deficiency of other specified B group vitamins: Secondary | ICD-10-CM | POA: Diagnosis not present

## 2021-06-14 DIAGNOSIS — I255 Ischemic cardiomyopathy: Secondary | ICD-10-CM | POA: Diagnosis not present

## 2021-06-14 DIAGNOSIS — R079 Chest pain, unspecified: Secondary | ICD-10-CM | POA: Diagnosis not present

## 2021-06-14 DIAGNOSIS — E782 Mixed hyperlipidemia: Secondary | ICD-10-CM | POA: Diagnosis not present

## 2021-06-14 DIAGNOSIS — I420 Dilated cardiomyopathy: Secondary | ICD-10-CM | POA: Diagnosis not present

## 2021-06-14 DIAGNOSIS — I2510 Atherosclerotic heart disease of native coronary artery without angina pectoris: Secondary | ICD-10-CM | POA: Diagnosis not present

## 2021-06-14 DIAGNOSIS — Z9861 Coronary angioplasty status: Secondary | ICD-10-CM | POA: Diagnosis not present

## 2021-06-14 DIAGNOSIS — E669 Obesity, unspecified: Secondary | ICD-10-CM | POA: Diagnosis not present

## 2021-06-14 DIAGNOSIS — I10 Essential (primary) hypertension: Secondary | ICD-10-CM | POA: Diagnosis not present

## 2021-06-26 DIAGNOSIS — R002 Palpitations: Secondary | ICD-10-CM | POA: Diagnosis not present

## 2021-06-26 DIAGNOSIS — Z79899 Other long term (current) drug therapy: Secondary | ICD-10-CM | POA: Diagnosis not present

## 2021-06-26 DIAGNOSIS — I25708 Atherosclerosis of coronary artery bypass graft(s), unspecified, with other forms of angina pectoris: Secondary | ICD-10-CM | POA: Diagnosis not present

## 2021-06-26 DIAGNOSIS — I420 Dilated cardiomyopathy: Secondary | ICD-10-CM | POA: Diagnosis not present

## 2021-06-26 DIAGNOSIS — I152 Hypertension secondary to endocrine disorders: Secondary | ICD-10-CM | POA: Diagnosis not present

## 2021-06-26 DIAGNOSIS — I509 Heart failure, unspecified: Secondary | ICD-10-CM | POA: Diagnosis not present

## 2021-06-26 DIAGNOSIS — I10 Essential (primary) hypertension: Secondary | ICD-10-CM | POA: Diagnosis not present

## 2021-06-26 DIAGNOSIS — I255 Ischemic cardiomyopathy: Secondary | ICD-10-CM | POA: Diagnosis not present

## 2021-06-26 DIAGNOSIS — E1159 Type 2 diabetes mellitus with other circulatory complications: Secondary | ICD-10-CM | POA: Diagnosis not present

## 2021-06-27 DIAGNOSIS — I420 Dilated cardiomyopathy: Secondary | ICD-10-CM | POA: Diagnosis not present

## 2021-06-28 DIAGNOSIS — I420 Dilated cardiomyopathy: Secondary | ICD-10-CM | POA: Diagnosis not present

## 2021-07-02 DIAGNOSIS — E669 Obesity, unspecified: Secondary | ICD-10-CM | POA: Diagnosis not present

## 2021-07-02 DIAGNOSIS — I10 Essential (primary) hypertension: Secondary | ICD-10-CM | POA: Diagnosis not present

## 2021-07-02 DIAGNOSIS — I420 Dilated cardiomyopathy: Secondary | ICD-10-CM | POA: Diagnosis not present

## 2021-07-02 DIAGNOSIS — I2510 Atherosclerotic heart disease of native coronary artery without angina pectoris: Secondary | ICD-10-CM | POA: Diagnosis not present

## 2021-07-02 DIAGNOSIS — I255 Ischemic cardiomyopathy: Secondary | ICD-10-CM | POA: Diagnosis not present

## 2021-07-02 DIAGNOSIS — Z9861 Coronary angioplasty status: Secondary | ICD-10-CM | POA: Diagnosis not present

## 2021-07-02 DIAGNOSIS — E782 Mixed hyperlipidemia: Secondary | ICD-10-CM | POA: Diagnosis not present

## 2021-07-08 DIAGNOSIS — E538 Deficiency of other specified B group vitamins: Secondary | ICD-10-CM | POA: Diagnosis not present

## 2021-07-12 DIAGNOSIS — R0602 Shortness of breath: Secondary | ICD-10-CM | POA: Diagnosis not present

## 2021-07-12 DIAGNOSIS — I2510 Atherosclerotic heart disease of native coronary artery without angina pectoris: Secondary | ICD-10-CM | POA: Diagnosis not present

## 2021-07-12 DIAGNOSIS — E782 Mixed hyperlipidemia: Secondary | ICD-10-CM | POA: Diagnosis not present

## 2021-07-12 DIAGNOSIS — I10 Essential (primary) hypertension: Secondary | ICD-10-CM | POA: Diagnosis not present

## 2021-07-12 DIAGNOSIS — I255 Ischemic cardiomyopathy: Secondary | ICD-10-CM | POA: Diagnosis not present

## 2021-07-19 DIAGNOSIS — I25708 Atherosclerosis of coronary artery bypass graft(s), unspecified, with other forms of angina pectoris: Secondary | ICD-10-CM | POA: Diagnosis not present

## 2021-07-19 DIAGNOSIS — I10 Essential (primary) hypertension: Secondary | ICD-10-CM | POA: Diagnosis not present

## 2021-07-19 DIAGNOSIS — R002 Palpitations: Secondary | ICD-10-CM | POA: Diagnosis not present

## 2021-07-19 DIAGNOSIS — I420 Dilated cardiomyopathy: Secondary | ICD-10-CM | POA: Diagnosis not present

## 2021-07-19 DIAGNOSIS — I255 Ischemic cardiomyopathy: Secondary | ICD-10-CM | POA: Diagnosis not present

## 2021-08-05 DIAGNOSIS — R002 Palpitations: Secondary | ICD-10-CM | POA: Diagnosis not present

## 2021-08-05 DIAGNOSIS — I25708 Atherosclerosis of coronary artery bypass graft(s), unspecified, with other forms of angina pectoris: Secondary | ICD-10-CM | POA: Diagnosis not present

## 2021-08-05 DIAGNOSIS — I509 Heart failure, unspecified: Secondary | ICD-10-CM | POA: Diagnosis not present

## 2021-08-05 DIAGNOSIS — E1159 Type 2 diabetes mellitus with other circulatory complications: Secondary | ICD-10-CM | POA: Diagnosis not present

## 2021-08-05 DIAGNOSIS — I10 Essential (primary) hypertension: Secondary | ICD-10-CM | POA: Diagnosis not present

## 2021-08-05 DIAGNOSIS — I255 Ischemic cardiomyopathy: Secondary | ICD-10-CM | POA: Diagnosis not present

## 2021-08-05 DIAGNOSIS — I152 Hypertension secondary to endocrine disorders: Secondary | ICD-10-CM | POA: Diagnosis not present

## 2021-08-05 DIAGNOSIS — Z79899 Other long term (current) drug therapy: Secondary | ICD-10-CM | POA: Diagnosis not present

## 2021-08-05 DIAGNOSIS — I420 Dilated cardiomyopathy: Secondary | ICD-10-CM | POA: Diagnosis not present

## 2021-08-06 DIAGNOSIS — E538 Deficiency of other specified B group vitamins: Secondary | ICD-10-CM | POA: Diagnosis not present

## 2021-08-16 DIAGNOSIS — I255 Ischemic cardiomyopathy: Secondary | ICD-10-CM | POA: Diagnosis not present

## 2021-08-16 DIAGNOSIS — I420 Dilated cardiomyopathy: Secondary | ICD-10-CM | POA: Diagnosis not present

## 2021-08-16 DIAGNOSIS — E1169 Type 2 diabetes mellitus with other specified complication: Secondary | ICD-10-CM | POA: Diagnosis not present

## 2021-08-16 DIAGNOSIS — I25708 Atherosclerosis of coronary artery bypass graft(s), unspecified, with other forms of angina pectoris: Secondary | ICD-10-CM | POA: Diagnosis not present

## 2021-08-16 DIAGNOSIS — I10 Essential (primary) hypertension: Secondary | ICD-10-CM | POA: Diagnosis not present

## 2021-08-16 DIAGNOSIS — E782 Mixed hyperlipidemia: Secondary | ICD-10-CM | POA: Diagnosis not present

## 2021-09-10 DIAGNOSIS — E538 Deficiency of other specified B group vitamins: Secondary | ICD-10-CM | POA: Diagnosis not present

## 2021-10-14 DIAGNOSIS — E538 Deficiency of other specified B group vitamins: Secondary | ICD-10-CM | POA: Diagnosis not present

## 2021-10-16 DIAGNOSIS — I152 Hypertension secondary to endocrine disorders: Secondary | ICD-10-CM | POA: Diagnosis not present

## 2021-10-16 DIAGNOSIS — D500 Iron deficiency anemia secondary to blood loss (chronic): Secondary | ICD-10-CM | POA: Diagnosis not present

## 2021-10-16 DIAGNOSIS — D509 Iron deficiency anemia, unspecified: Secondary | ICD-10-CM | POA: Diagnosis not present

## 2021-10-16 DIAGNOSIS — E538 Deficiency of other specified B group vitamins: Secondary | ICD-10-CM | POA: Diagnosis not present

## 2021-10-16 DIAGNOSIS — E1159 Type 2 diabetes mellitus with other circulatory complications: Secondary | ICD-10-CM | POA: Diagnosis not present

## 2021-10-16 DIAGNOSIS — K581 Irritable bowel syndrome with constipation: Secondary | ICD-10-CM | POA: Diagnosis not present

## 2021-10-18 DIAGNOSIS — E538 Deficiency of other specified B group vitamins: Secondary | ICD-10-CM | POA: Diagnosis not present

## 2021-10-18 DIAGNOSIS — E611 Iron deficiency: Secondary | ICD-10-CM | POA: Diagnosis not present

## 2021-11-13 DIAGNOSIS — Q666 Other congenital valgus deformities of feet: Secondary | ICD-10-CM | POA: Diagnosis not present

## 2021-11-13 DIAGNOSIS — E119 Type 2 diabetes mellitus without complications: Secondary | ICD-10-CM | POA: Diagnosis not present

## 2021-11-13 DIAGNOSIS — E538 Deficiency of other specified B group vitamins: Secondary | ICD-10-CM | POA: Diagnosis not present

## 2021-11-13 DIAGNOSIS — M722 Plantar fascial fibromatosis: Secondary | ICD-10-CM | POA: Diagnosis not present

## 2021-11-13 DIAGNOSIS — B353 Tinea pedis: Secondary | ICD-10-CM | POA: Diagnosis not present

## 2021-11-22 ENCOUNTER — Other Ambulatory Visit: Payer: Self-pay | Admitting: Internal Medicine

## 2021-11-22 ENCOUNTER — Other Ambulatory Visit: Payer: Self-pay | Admitting: *Deleted

## 2021-11-22 ENCOUNTER — Other Ambulatory Visit: Payer: Self-pay | Admitting: Gerontology

## 2021-11-22 DIAGNOSIS — N183 Chronic kidney disease, stage 3 unspecified: Secondary | ICD-10-CM | POA: Diagnosis not present

## 2021-11-22 DIAGNOSIS — E1169 Type 2 diabetes mellitus with other specified complication: Secondary | ICD-10-CM | POA: Diagnosis not present

## 2021-11-22 DIAGNOSIS — Z1329 Encounter for screening for other suspected endocrine disorder: Secondary | ICD-10-CM | POA: Diagnosis not present

## 2021-11-22 DIAGNOSIS — K5909 Other constipation: Secondary | ICD-10-CM | POA: Diagnosis not present

## 2021-11-22 DIAGNOSIS — Z0000 Encounter for general adult medical examination without abnormal findings: Secondary | ICD-10-CM | POA: Diagnosis not present

## 2021-11-22 DIAGNOSIS — D649 Anemia, unspecified: Secondary | ICD-10-CM | POA: Diagnosis not present

## 2021-11-22 DIAGNOSIS — I255 Ischemic cardiomyopathy: Secondary | ICD-10-CM | POA: Diagnosis not present

## 2021-11-22 DIAGNOSIS — I6529 Occlusion and stenosis of unspecified carotid artery: Secondary | ICD-10-CM | POA: Diagnosis not present

## 2021-11-22 DIAGNOSIS — I10 Essential (primary) hypertension: Secondary | ICD-10-CM | POA: Diagnosis not present

## 2021-11-22 DIAGNOSIS — I509 Heart failure, unspecified: Secondary | ICD-10-CM | POA: Diagnosis not present

## 2021-11-22 DIAGNOSIS — Z1231 Encounter for screening mammogram for malignant neoplasm of breast: Secondary | ICD-10-CM

## 2021-11-22 DIAGNOSIS — E538 Deficiency of other specified B group vitamins: Secondary | ICD-10-CM | POA: Diagnosis not present

## 2021-11-22 DIAGNOSIS — E782 Mixed hyperlipidemia: Secondary | ICD-10-CM | POA: Diagnosis not present

## 2021-11-22 DIAGNOSIS — R002 Palpitations: Secondary | ICD-10-CM | POA: Diagnosis not present

## 2021-11-22 DIAGNOSIS — I130 Hypertensive heart and chronic kidney disease with heart failure and stage 1 through stage 4 chronic kidney disease, or unspecified chronic kidney disease: Secondary | ICD-10-CM | POA: Diagnosis not present

## 2021-11-22 DIAGNOSIS — N1831 Chronic kidney disease, stage 3a: Secondary | ICD-10-CM | POA: Diagnosis not present

## 2021-11-22 DIAGNOSIS — I25708 Atherosclerosis of coronary artery bypass graft(s), unspecified, with other forms of angina pectoris: Secondary | ICD-10-CM | POA: Diagnosis not present

## 2021-11-22 DIAGNOSIS — I420 Dilated cardiomyopathy: Secondary | ICD-10-CM | POA: Diagnosis not present

## 2021-11-22 DIAGNOSIS — E559 Vitamin D deficiency, unspecified: Secondary | ICD-10-CM | POA: Diagnosis not present

## 2021-11-22 DIAGNOSIS — E1122 Type 2 diabetes mellitus with diabetic chronic kidney disease: Secondary | ICD-10-CM | POA: Diagnosis not present

## 2021-11-27 DIAGNOSIS — Z7189 Other specified counseling: Secondary | ICD-10-CM | POA: Diagnosis not present

## 2021-11-27 DIAGNOSIS — E1122 Type 2 diabetes mellitus with diabetic chronic kidney disease: Secondary | ICD-10-CM | POA: Diagnosis not present

## 2021-11-27 DIAGNOSIS — N1831 Chronic kidney disease, stage 3a: Secondary | ICD-10-CM | POA: Diagnosis not present

## 2021-11-28 DIAGNOSIS — E113213 Type 2 diabetes mellitus with mild nonproliferative diabetic retinopathy with macular edema, bilateral: Secondary | ICD-10-CM | POA: Diagnosis not present

## 2021-12-03 DIAGNOSIS — E559 Vitamin D deficiency, unspecified: Secondary | ICD-10-CM | POA: Diagnosis not present

## 2021-12-03 DIAGNOSIS — E1159 Type 2 diabetes mellitus with other circulatory complications: Secondary | ICD-10-CM | POA: Diagnosis not present

## 2021-12-03 DIAGNOSIS — E538 Deficiency of other specified B group vitamins: Secondary | ICD-10-CM | POA: Diagnosis not present

## 2021-12-03 DIAGNOSIS — I10 Essential (primary) hypertension: Secondary | ICD-10-CM | POA: Diagnosis not present

## 2021-12-03 DIAGNOSIS — Z1339 Encounter for screening examination for other mental health and behavioral disorders: Secondary | ICD-10-CM | POA: Diagnosis not present

## 2021-12-03 DIAGNOSIS — Z0000 Encounter for general adult medical examination without abnormal findings: Secondary | ICD-10-CM | POA: Diagnosis not present

## 2021-12-03 DIAGNOSIS — Z1331 Encounter for screening for depression: Secondary | ICD-10-CM | POA: Diagnosis not present

## 2021-12-03 DIAGNOSIS — Z23 Encounter for immunization: Secondary | ICD-10-CM | POA: Diagnosis not present

## 2021-12-03 DIAGNOSIS — I25118 Atherosclerotic heart disease of native coronary artery with other forms of angina pectoris: Secondary | ICD-10-CM | POA: Diagnosis not present

## 2021-12-10 ENCOUNTER — Ambulatory Visit
Admission: RE | Admit: 2021-12-10 | Discharge: 2021-12-10 | Disposition: A | Discharge status: Home / Self Care | Payer: Medicare HMO | Source: Ambulatory Visit | Attending: Gerontology | Admitting: Gerontology

## 2021-12-10 ENCOUNTER — Other Ambulatory Visit: Payer: Self-pay

## 2021-12-10 DIAGNOSIS — Z1231 Encounter for screening mammogram for malignant neoplasm of breast: Secondary | ICD-10-CM | POA: Diagnosis not present

## 2021-12-11 ENCOUNTER — Inpatient Hospital Stay
Admission: RE | Admit: 2021-12-11 | Discharge: 2021-12-11 | Disposition: A | Discharge status: Home / Self Care | Payer: Self-pay | Source: Ambulatory Visit | Attending: *Deleted | Admitting: *Deleted

## 2021-12-11 ENCOUNTER — Other Ambulatory Visit: Payer: Self-pay | Admitting: *Deleted

## 2021-12-11 DIAGNOSIS — Z1231 Encounter for screening mammogram for malignant neoplasm of breast: Secondary | ICD-10-CM

## 2021-12-13 DIAGNOSIS — E538 Deficiency of other specified B group vitamins: Secondary | ICD-10-CM | POA: Diagnosis not present

## 2021-12-25 DIAGNOSIS — Z79899 Other long term (current) drug therapy: Secondary | ICD-10-CM | POA: Diagnosis not present

## 2021-12-25 DIAGNOSIS — D510 Vitamin B12 deficiency anemia due to intrinsic factor deficiency: Secondary | ICD-10-CM | POA: Diagnosis not present

## 2021-12-25 DIAGNOSIS — E538 Deficiency of other specified B group vitamins: Secondary | ICD-10-CM | POA: Diagnosis not present

## 2021-12-25 DIAGNOSIS — Z862 Personal history of diseases of the blood and blood-forming organs and certain disorders involving the immune mechanism: Secondary | ICD-10-CM | POA: Diagnosis not present

## 2022-01-10 DIAGNOSIS — I255 Ischemic cardiomyopathy: Secondary | ICD-10-CM | POA: Diagnosis not present

## 2022-01-10 DIAGNOSIS — E7800 Pure hypercholesterolemia, unspecified: Secondary | ICD-10-CM | POA: Diagnosis not present

## 2022-01-10 DIAGNOSIS — I25119 Atherosclerotic heart disease of native coronary artery with unspecified angina pectoris: Secondary | ICD-10-CM | POA: Diagnosis not present

## 2022-01-10 DIAGNOSIS — E119 Type 2 diabetes mellitus without complications: Secondary | ICD-10-CM | POA: Diagnosis not present

## 2022-01-10 DIAGNOSIS — R079 Chest pain, unspecified: Secondary | ICD-10-CM | POA: Diagnosis not present

## 2022-01-10 DIAGNOSIS — I10 Essential (primary) hypertension: Secondary | ICD-10-CM | POA: Diagnosis not present

## 2022-01-17 DIAGNOSIS — Z862 Personal history of diseases of the blood and blood-forming organs and certain disorders involving the immune mechanism: Secondary | ICD-10-CM | POA: Diagnosis not present

## 2022-01-17 DIAGNOSIS — Z0189 Encounter for other specified special examinations: Secondary | ICD-10-CM | POA: Diagnosis not present

## 2022-01-22 DIAGNOSIS — D510 Vitamin B12 deficiency anemia due to intrinsic factor deficiency: Secondary | ICD-10-CM | POA: Diagnosis not present

## 2022-01-22 DIAGNOSIS — K581 Irritable bowel syndrome with constipation: Secondary | ICD-10-CM | POA: Diagnosis not present

## 2022-01-27 DIAGNOSIS — E538 Deficiency of other specified B group vitamins: Secondary | ICD-10-CM | POA: Diagnosis not present

## 2022-01-27 DIAGNOSIS — Z862 Personal history of diseases of the blood and blood-forming organs and certain disorders involving the immune mechanism: Secondary | ICD-10-CM | POA: Diagnosis not present

## 2022-02-12 DIAGNOSIS — R002 Palpitations: Secondary | ICD-10-CM | POA: Diagnosis not present

## 2022-02-12 DIAGNOSIS — I420 Dilated cardiomyopathy: Secondary | ICD-10-CM | POA: Diagnosis not present

## 2022-02-12 DIAGNOSIS — E782 Mixed hyperlipidemia: Secondary | ICD-10-CM | POA: Diagnosis not present

## 2022-02-12 DIAGNOSIS — I10 Essential (primary) hypertension: Secondary | ICD-10-CM | POA: Diagnosis not present

## 2022-02-12 DIAGNOSIS — I25708 Atherosclerosis of coronary artery bypass graft(s), unspecified, with other forms of angina pectoris: Secondary | ICD-10-CM | POA: Diagnosis not present

## 2022-02-12 DIAGNOSIS — E1169 Type 2 diabetes mellitus with other specified complication: Secondary | ICD-10-CM | POA: Diagnosis not present

## 2022-02-12 DIAGNOSIS — I255 Ischemic cardiomyopathy: Secondary | ICD-10-CM | POA: Diagnosis not present

## 2022-02-24 DIAGNOSIS — Z862 Personal history of diseases of the blood and blood-forming organs and certain disorders involving the immune mechanism: Secondary | ICD-10-CM | POA: Diagnosis not present

## 2022-02-24 DIAGNOSIS — E538 Deficiency of other specified B group vitamins: Secondary | ICD-10-CM | POA: Diagnosis not present

## 2022-03-06 DIAGNOSIS — I255 Ischemic cardiomyopathy: Secondary | ICD-10-CM | POA: Diagnosis not present

## 2022-03-10 DIAGNOSIS — E119 Type 2 diabetes mellitus without complications: Secondary | ICD-10-CM | POA: Diagnosis not present

## 2022-03-10 DIAGNOSIS — R079 Chest pain, unspecified: Secondary | ICD-10-CM | POA: Diagnosis not present

## 2022-03-10 DIAGNOSIS — E7800 Pure hypercholesterolemia, unspecified: Secondary | ICD-10-CM | POA: Diagnosis not present

## 2022-03-10 DIAGNOSIS — I10 Essential (primary) hypertension: Secondary | ICD-10-CM | POA: Diagnosis not present

## 2022-03-10 DIAGNOSIS — I25119 Atherosclerotic heart disease of native coronary artery with unspecified angina pectoris: Secondary | ICD-10-CM | POA: Diagnosis not present

## 2022-03-10 DIAGNOSIS — I255 Ischemic cardiomyopathy: Secondary | ICD-10-CM | POA: Diagnosis not present

## 2022-03-11 DIAGNOSIS — I255 Ischemic cardiomyopathy: Secondary | ICD-10-CM | POA: Diagnosis not present

## 2022-03-14 DIAGNOSIS — E1159 Type 2 diabetes mellitus with other circulatory complications: Secondary | ICD-10-CM | POA: Diagnosis not present

## 2022-03-14 DIAGNOSIS — E538 Deficiency of other specified B group vitamins: Secondary | ICD-10-CM | POA: Diagnosis not present

## 2022-03-14 DIAGNOSIS — K5909 Other constipation: Secondary | ICD-10-CM | POA: Diagnosis not present

## 2022-03-14 DIAGNOSIS — E559 Vitamin D deficiency, unspecified: Secondary | ICD-10-CM | POA: Diagnosis not present

## 2022-03-14 DIAGNOSIS — I129 Hypertensive chronic kidney disease with stage 1 through stage 4 chronic kidney disease, or unspecified chronic kidney disease: Secondary | ICD-10-CM | POA: Diagnosis not present

## 2022-03-14 DIAGNOSIS — E1122 Type 2 diabetes mellitus with diabetic chronic kidney disease: Secondary | ICD-10-CM | POA: Diagnosis not present

## 2022-03-14 DIAGNOSIS — Z79899 Other long term (current) drug therapy: Secondary | ICD-10-CM | POA: Diagnosis not present

## 2022-03-14 DIAGNOSIS — I25118 Atherosclerotic heart disease of native coronary artery with other forms of angina pectoris: Secondary | ICD-10-CM | POA: Diagnosis not present

## 2022-03-14 DIAGNOSIS — E782 Mixed hyperlipidemia: Secondary | ICD-10-CM | POA: Diagnosis not present

## 2022-03-14 DIAGNOSIS — N1831 Chronic kidney disease, stage 3a: Secondary | ICD-10-CM | POA: Diagnosis not present

## 2022-03-19 DIAGNOSIS — E538 Deficiency of other specified B group vitamins: Secondary | ICD-10-CM | POA: Diagnosis not present

## 2022-03-19 DIAGNOSIS — D510 Vitamin B12 deficiency anemia due to intrinsic factor deficiency: Secondary | ICD-10-CM | POA: Diagnosis not present

## 2022-03-19 DIAGNOSIS — E611 Iron deficiency: Secondary | ICD-10-CM | POA: Diagnosis not present

## 2022-03-26 DIAGNOSIS — E538 Deficiency of other specified B group vitamins: Secondary | ICD-10-CM | POA: Diagnosis not present

## 2022-03-26 DIAGNOSIS — I255 Ischemic cardiomyopathy: Secondary | ICD-10-CM | POA: Diagnosis not present

## 2022-03-26 DIAGNOSIS — I509 Heart failure, unspecified: Secondary | ICD-10-CM | POA: Diagnosis not present

## 2022-03-26 DIAGNOSIS — I25708 Atherosclerosis of coronary artery bypass graft(s), unspecified, with other forms of angina pectoris: Secondary | ICD-10-CM | POA: Diagnosis not present

## 2022-03-26 DIAGNOSIS — I420 Dilated cardiomyopathy: Secondary | ICD-10-CM | POA: Diagnosis not present

## 2022-04-04 DIAGNOSIS — I255 Ischemic cardiomyopathy: Secondary | ICD-10-CM | POA: Diagnosis not present

## 2022-04-04 DIAGNOSIS — I25708 Atherosclerosis of coronary artery bypass graft(s), unspecified, with other forms of angina pectoris: Secondary | ICD-10-CM | POA: Diagnosis not present

## 2022-04-04 DIAGNOSIS — I420 Dilated cardiomyopathy: Secondary | ICD-10-CM | POA: Diagnosis not present

## 2022-04-11 DIAGNOSIS — K5900 Constipation, unspecified: Secondary | ICD-10-CM | POA: Diagnosis not present

## 2022-04-14 DIAGNOSIS — E1169 Type 2 diabetes mellitus with other specified complication: Secondary | ICD-10-CM | POA: Diagnosis not present

## 2022-04-14 DIAGNOSIS — I509 Heart failure, unspecified: Secondary | ICD-10-CM | POA: Diagnosis not present

## 2022-04-14 DIAGNOSIS — E782 Mixed hyperlipidemia: Secondary | ICD-10-CM | POA: Diagnosis not present

## 2022-04-14 DIAGNOSIS — I420 Dilated cardiomyopathy: Secondary | ICD-10-CM | POA: Diagnosis not present

## 2022-04-14 DIAGNOSIS — I10 Essential (primary) hypertension: Secondary | ICD-10-CM | POA: Diagnosis not present

## 2022-04-14 DIAGNOSIS — I25708 Atherosclerosis of coronary artery bypass graft(s), unspecified, with other forms of angina pectoris: Secondary | ICD-10-CM | POA: Diagnosis not present

## 2022-04-14 DIAGNOSIS — I255 Ischemic cardiomyopathy: Secondary | ICD-10-CM | POA: Diagnosis not present

## 2022-04-21 DIAGNOSIS — E119 Type 2 diabetes mellitus without complications: Secondary | ICD-10-CM | POA: Diagnosis not present

## 2022-04-21 DIAGNOSIS — H25813 Combined forms of age-related cataract, bilateral: Secondary | ICD-10-CM | POA: Diagnosis not present

## 2022-04-30 DIAGNOSIS — I509 Heart failure, unspecified: Secondary | ICD-10-CM | POA: Diagnosis not present

## 2022-04-30 DIAGNOSIS — I25708 Atherosclerosis of coronary artery bypass graft(s), unspecified, with other forms of angina pectoris: Secondary | ICD-10-CM | POA: Diagnosis not present

## 2022-04-30 DIAGNOSIS — I420 Dilated cardiomyopathy: Secondary | ICD-10-CM | POA: Diagnosis not present

## 2022-04-30 DIAGNOSIS — I255 Ischemic cardiomyopathy: Secondary | ICD-10-CM | POA: Diagnosis not present

## 2022-05-01 DIAGNOSIS — E538 Deficiency of other specified B group vitamins: Secondary | ICD-10-CM | POA: Diagnosis not present

## 2022-05-05 DIAGNOSIS — D509 Iron deficiency anemia, unspecified: Secondary | ICD-10-CM | POA: Diagnosis not present

## 2022-05-05 DIAGNOSIS — I110 Hypertensive heart disease with heart failure: Secondary | ICD-10-CM | POA: Diagnosis not present

## 2022-05-05 DIAGNOSIS — K2940 Chronic atrophic gastritis without bleeding: Secondary | ICD-10-CM | POA: Diagnosis not present

## 2022-05-05 DIAGNOSIS — E1169 Type 2 diabetes mellitus with other specified complication: Secondary | ICD-10-CM | POA: Diagnosis not present

## 2022-05-05 DIAGNOSIS — D510 Vitamin B12 deficiency anemia due to intrinsic factor deficiency: Secondary | ICD-10-CM | POA: Diagnosis not present

## 2022-05-05 DIAGNOSIS — E782 Mixed hyperlipidemia: Secondary | ICD-10-CM | POA: Diagnosis not present

## 2022-05-05 DIAGNOSIS — K2950 Unspecified chronic gastritis without bleeding: Secondary | ICD-10-CM | POA: Diagnosis not present

## 2022-05-05 DIAGNOSIS — I25118 Atherosclerotic heart disease of native coronary artery with other forms of angina pectoris: Secondary | ICD-10-CM | POA: Diagnosis not present

## 2022-05-05 DIAGNOSIS — I420 Dilated cardiomyopathy: Secondary | ICD-10-CM | POA: Diagnosis not present

## 2022-05-20 DIAGNOSIS — M79672 Pain in left foot: Secondary | ICD-10-CM | POA: Diagnosis not present

## 2022-05-20 DIAGNOSIS — S99922A Unspecified injury of left foot, initial encounter: Secondary | ICD-10-CM | POA: Diagnosis not present

## 2022-05-28 DIAGNOSIS — E538 Deficiency of other specified B group vitamins: Secondary | ICD-10-CM | POA: Diagnosis not present

## 2022-06-10 DIAGNOSIS — E782 Mixed hyperlipidemia: Secondary | ICD-10-CM | POA: Diagnosis not present

## 2022-06-10 DIAGNOSIS — Z1331 Encounter for screening for depression: Secondary | ICD-10-CM | POA: Diagnosis not present

## 2022-06-10 DIAGNOSIS — N1831 Chronic kidney disease, stage 3a: Secondary | ICD-10-CM | POA: Diagnosis not present

## 2022-06-10 DIAGNOSIS — I10 Essential (primary) hypertension: Secondary | ICD-10-CM | POA: Diagnosis not present

## 2022-06-10 DIAGNOSIS — Z79899 Other long term (current) drug therapy: Secondary | ICD-10-CM | POA: Diagnosis not present

## 2022-06-10 DIAGNOSIS — I25118 Atherosclerotic heart disease of native coronary artery with other forms of angina pectoris: Secondary | ICD-10-CM | POA: Diagnosis not present

## 2022-06-10 DIAGNOSIS — E1159 Type 2 diabetes mellitus with other circulatory complications: Secondary | ICD-10-CM | POA: Diagnosis not present

## 2022-06-10 DIAGNOSIS — G5603 Carpal tunnel syndrome, bilateral upper limbs: Secondary | ICD-10-CM | POA: Diagnosis not present

## 2022-06-10 DIAGNOSIS — K5909 Other constipation: Secondary | ICD-10-CM | POA: Diagnosis not present

## 2022-06-25 DIAGNOSIS — K2940 Chronic atrophic gastritis without bleeding: Secondary | ICD-10-CM | POA: Diagnosis not present

## 2022-06-25 DIAGNOSIS — K5900 Constipation, unspecified: Secondary | ICD-10-CM | POA: Diagnosis not present

## 2022-06-25 DIAGNOSIS — E611 Iron deficiency: Secondary | ICD-10-CM | POA: Diagnosis not present

## 2022-06-25 DIAGNOSIS — Z862 Personal history of diseases of the blood and blood-forming organs and certain disorders involving the immune mechanism: Secondary | ICD-10-CM | POA: Diagnosis not present

## 2022-06-25 DIAGNOSIS — E538 Deficiency of other specified B group vitamins: Secondary | ICD-10-CM | POA: Diagnosis not present

## 2022-06-25 DIAGNOSIS — D510 Vitamin B12 deficiency anemia due to intrinsic factor deficiency: Secondary | ICD-10-CM | POA: Diagnosis not present

## 2022-06-27 DIAGNOSIS — E119 Type 2 diabetes mellitus without complications: Secondary | ICD-10-CM | POA: Diagnosis not present

## 2022-06-27 DIAGNOSIS — H04123 Dry eye syndrome of bilateral lacrimal glands: Secondary | ICD-10-CM | POA: Diagnosis not present

## 2022-06-27 DIAGNOSIS — H25813 Combined forms of age-related cataract, bilateral: Secondary | ICD-10-CM | POA: Diagnosis not present

## 2022-07-02 DIAGNOSIS — E1169 Type 2 diabetes mellitus with other specified complication: Secondary | ICD-10-CM | POA: Diagnosis not present

## 2022-07-02 DIAGNOSIS — I420 Dilated cardiomyopathy: Secondary | ICD-10-CM | POA: Diagnosis not present

## 2022-07-02 DIAGNOSIS — E782 Mixed hyperlipidemia: Secondary | ICD-10-CM | POA: Diagnosis not present

## 2022-07-02 DIAGNOSIS — I255 Ischemic cardiomyopathy: Secondary | ICD-10-CM | POA: Diagnosis not present

## 2022-07-02 DIAGNOSIS — I509 Heart failure, unspecified: Secondary | ICD-10-CM | POA: Diagnosis not present

## 2022-07-02 DIAGNOSIS — I25708 Atherosclerosis of coronary artery bypass graft(s), unspecified, with other forms of angina pectoris: Secondary | ICD-10-CM | POA: Diagnosis not present

## 2022-07-11 DIAGNOSIS — Z1389 Encounter for screening for other disorder: Secondary | ICD-10-CM | POA: Diagnosis not present

## 2022-07-11 DIAGNOSIS — E559 Vitamin D deficiency, unspecified: Secondary | ICD-10-CM | POA: Diagnosis not present

## 2022-07-11 DIAGNOSIS — K5909 Other constipation: Secondary | ICD-10-CM | POA: Diagnosis not present

## 2022-07-11 DIAGNOSIS — E1122 Type 2 diabetes mellitus with diabetic chronic kidney disease: Secondary | ICD-10-CM | POA: Diagnosis not present

## 2022-07-11 DIAGNOSIS — I130 Hypertensive heart and chronic kidney disease with heart failure and stage 1 through stage 4 chronic kidney disease, or unspecified chronic kidney disease: Secondary | ICD-10-CM | POA: Diagnosis not present

## 2022-07-11 DIAGNOSIS — E782 Mixed hyperlipidemia: Secondary | ICD-10-CM | POA: Diagnosis not present

## 2022-07-11 DIAGNOSIS — E1159 Type 2 diabetes mellitus with other circulatory complications: Secondary | ICD-10-CM | POA: Diagnosis not present

## 2022-07-11 DIAGNOSIS — I509 Heart failure, unspecified: Secondary | ICD-10-CM | POA: Diagnosis not present

## 2022-07-11 DIAGNOSIS — I255 Ischemic cardiomyopathy: Secondary | ICD-10-CM | POA: Diagnosis not present

## 2022-07-11 DIAGNOSIS — Z0000 Encounter for general adult medical examination without abnormal findings: Secondary | ICD-10-CM | POA: Diagnosis not present

## 2022-07-11 DIAGNOSIS — R002 Palpitations: Secondary | ICD-10-CM | POA: Diagnosis not present

## 2022-07-11 DIAGNOSIS — N1831 Chronic kidney disease, stage 3a: Secondary | ICD-10-CM | POA: Diagnosis not present

## 2022-07-11 DIAGNOSIS — I6529 Occlusion and stenosis of unspecified carotid artery: Secondary | ICD-10-CM | POA: Diagnosis not present

## 2022-07-11 DIAGNOSIS — I10 Essential (primary) hypertension: Secondary | ICD-10-CM | POA: Diagnosis not present

## 2022-07-11 DIAGNOSIS — E1169 Type 2 diabetes mellitus with other specified complication: Secondary | ICD-10-CM | POA: Diagnosis not present

## 2022-07-11 DIAGNOSIS — I25118 Atherosclerotic heart disease of native coronary artery with other forms of angina pectoris: Secondary | ICD-10-CM | POA: Diagnosis not present

## 2022-07-11 DIAGNOSIS — Z1329 Encounter for screening for other suspected endocrine disorder: Secondary | ICD-10-CM | POA: Diagnosis not present

## 2022-07-11 DIAGNOSIS — E538 Deficiency of other specified B group vitamins: Secondary | ICD-10-CM | POA: Diagnosis not present

## 2022-07-15 DIAGNOSIS — I25708 Atherosclerosis of coronary artery bypass graft(s), unspecified, with other forms of angina pectoris: Secondary | ICD-10-CM | POA: Diagnosis not present

## 2022-07-15 DIAGNOSIS — N1831 Chronic kidney disease, stage 3a: Secondary | ICD-10-CM | POA: Diagnosis not present

## 2022-07-15 DIAGNOSIS — E1122 Type 2 diabetes mellitus with diabetic chronic kidney disease: Secondary | ICD-10-CM | POA: Diagnosis not present

## 2022-07-15 DIAGNOSIS — E1159 Type 2 diabetes mellitus with other circulatory complications: Secondary | ICD-10-CM | POA: Diagnosis not present

## 2022-07-15 DIAGNOSIS — I255 Ischemic cardiomyopathy: Secondary | ICD-10-CM | POA: Diagnosis not present

## 2022-07-15 DIAGNOSIS — I6529 Occlusion and stenosis of unspecified carotid artery: Secondary | ICD-10-CM | POA: Diagnosis not present

## 2022-07-15 DIAGNOSIS — I420 Dilated cardiomyopathy: Secondary | ICD-10-CM | POA: Diagnosis not present

## 2022-07-15 DIAGNOSIS — I509 Heart failure, unspecified: Secondary | ICD-10-CM | POA: Diagnosis not present

## 2022-07-15 DIAGNOSIS — E1169 Type 2 diabetes mellitus with other specified complication: Secondary | ICD-10-CM | POA: Diagnosis not present

## 2022-07-15 DIAGNOSIS — R002 Palpitations: Secondary | ICD-10-CM | POA: Diagnosis not present

## 2022-07-18 DIAGNOSIS — I420 Dilated cardiomyopathy: Secondary | ICD-10-CM | POA: Diagnosis not present

## 2022-07-18 DIAGNOSIS — I255 Ischemic cardiomyopathy: Secondary | ICD-10-CM | POA: Diagnosis not present

## 2022-07-18 DIAGNOSIS — I25708 Atherosclerosis of coronary artery bypass graft(s), unspecified, with other forms of angina pectoris: Secondary | ICD-10-CM | POA: Diagnosis not present

## 2022-07-18 DIAGNOSIS — I25118 Atherosclerotic heart disease of native coronary artery with other forms of angina pectoris: Secondary | ICD-10-CM | POA: Diagnosis not present

## 2022-07-18 DIAGNOSIS — E782 Mixed hyperlipidemia: Secondary | ICD-10-CM | POA: Diagnosis not present

## 2022-07-18 DIAGNOSIS — E538 Deficiency of other specified B group vitamins: Secondary | ICD-10-CM | POA: Diagnosis not present

## 2022-07-18 DIAGNOSIS — E1169 Type 2 diabetes mellitus with other specified complication: Secondary | ICD-10-CM | POA: Diagnosis not present

## 2022-07-18 DIAGNOSIS — I509 Heart failure, unspecified: Secondary | ICD-10-CM | POA: Diagnosis not present

## 2022-08-06 DIAGNOSIS — M6530 Trigger finger, unspecified finger: Secondary | ICD-10-CM | POA: Diagnosis not present

## 2022-08-13 DIAGNOSIS — R278 Other lack of coordination: Secondary | ICD-10-CM | POA: Diagnosis not present

## 2022-08-25 DIAGNOSIS — E538 Deficiency of other specified B group vitamins: Secondary | ICD-10-CM | POA: Diagnosis not present

## 2022-09-05 DIAGNOSIS — E782 Mixed hyperlipidemia: Secondary | ICD-10-CM | POA: Diagnosis not present

## 2022-09-05 DIAGNOSIS — E1159 Type 2 diabetes mellitus with other circulatory complications: Secondary | ICD-10-CM | POA: Diagnosis not present

## 2022-09-05 DIAGNOSIS — Z79899 Other long term (current) drug therapy: Secondary | ICD-10-CM | POA: Diagnosis not present

## 2022-09-05 DIAGNOSIS — N1831 Chronic kidney disease, stage 3a: Secondary | ICD-10-CM | POA: Diagnosis not present

## 2022-09-11 DIAGNOSIS — E782 Mixed hyperlipidemia: Secondary | ICD-10-CM | POA: Diagnosis not present

## 2022-09-11 DIAGNOSIS — K5909 Other constipation: Secondary | ICD-10-CM | POA: Diagnosis not present

## 2022-09-11 DIAGNOSIS — I10 Essential (primary) hypertension: Secondary | ICD-10-CM | POA: Diagnosis not present

## 2022-09-11 DIAGNOSIS — I25118 Atherosclerotic heart disease of native coronary artery with other forms of angina pectoris: Secondary | ICD-10-CM | POA: Diagnosis not present

## 2022-09-11 DIAGNOSIS — E538 Deficiency of other specified B group vitamins: Secondary | ICD-10-CM | POA: Diagnosis not present

## 2022-09-11 DIAGNOSIS — D649 Anemia, unspecified: Secondary | ICD-10-CM | POA: Diagnosis not present

## 2022-09-11 DIAGNOSIS — E1159 Type 2 diabetes mellitus with other circulatory complications: Secondary | ICD-10-CM | POA: Diagnosis not present

## 2022-09-11 DIAGNOSIS — N1831 Chronic kidney disease, stage 3a: Secondary | ICD-10-CM | POA: Diagnosis not present

## 2022-09-11 DIAGNOSIS — E559 Vitamin D deficiency, unspecified: Secondary | ICD-10-CM | POA: Diagnosis not present

## 2022-09-24 DIAGNOSIS — E538 Deficiency of other specified B group vitamins: Secondary | ICD-10-CM | POA: Diagnosis not present

## 2022-10-02 DIAGNOSIS — E119 Type 2 diabetes mellitus without complications: Secondary | ICD-10-CM | POA: Diagnosis not present

## 2022-10-02 DIAGNOSIS — M79671 Pain in right foot: Secondary | ICD-10-CM | POA: Diagnosis not present

## 2022-10-02 DIAGNOSIS — S90111A Contusion of right great toe without damage to nail, initial encounter: Secondary | ICD-10-CM | POA: Diagnosis not present

## 2022-10-03 DIAGNOSIS — E119 Type 2 diabetes mellitus without complications: Secondary | ICD-10-CM | POA: Diagnosis not present

## 2022-10-03 DIAGNOSIS — H04123 Dry eye syndrome of bilateral lacrimal glands: Secondary | ICD-10-CM | POA: Diagnosis not present

## 2022-10-03 DIAGNOSIS — H25813 Combined forms of age-related cataract, bilateral: Secondary | ICD-10-CM | POA: Diagnosis not present

## 2022-10-06 DIAGNOSIS — E538 Deficiency of other specified B group vitamins: Secondary | ICD-10-CM | POA: Diagnosis not present

## 2022-10-06 DIAGNOSIS — D649 Anemia, unspecified: Secondary | ICD-10-CM | POA: Diagnosis not present

## 2022-10-06 DIAGNOSIS — E1159 Type 2 diabetes mellitus with other circulatory complications: Secondary | ICD-10-CM | POA: Diagnosis not present

## 2022-10-06 DIAGNOSIS — N1831 Chronic kidney disease, stage 3a: Secondary | ICD-10-CM | POA: Diagnosis not present

## 2022-10-06 DIAGNOSIS — E559 Vitamin D deficiency, unspecified: Secondary | ICD-10-CM | POA: Diagnosis not present

## 2022-10-09 DIAGNOSIS — D631 Anemia in chronic kidney disease: Secondary | ICD-10-CM | POA: Diagnosis not present

## 2022-10-09 DIAGNOSIS — Z79899 Other long term (current) drug therapy: Secondary | ICD-10-CM | POA: Diagnosis not present

## 2022-10-09 DIAGNOSIS — E1122 Type 2 diabetes mellitus with diabetic chronic kidney disease: Secondary | ICD-10-CM | POA: Diagnosis not present

## 2022-10-09 DIAGNOSIS — I10 Essential (primary) hypertension: Secondary | ICD-10-CM | POA: Diagnosis not present

## 2022-10-09 DIAGNOSIS — N1831 Chronic kidney disease, stage 3a: Secondary | ICD-10-CM | POA: Diagnosis not present

## 2022-10-09 DIAGNOSIS — E782 Mixed hyperlipidemia: Secondary | ICD-10-CM | POA: Diagnosis not present

## 2022-10-09 DIAGNOSIS — Z2821 Immunization not carried out because of patient refusal: Secondary | ICD-10-CM | POA: Diagnosis not present

## 2022-10-09 DIAGNOSIS — I25118 Atherosclerotic heart disease of native coronary artery with other forms of angina pectoris: Secondary | ICD-10-CM | POA: Diagnosis not present

## 2022-10-09 DIAGNOSIS — E538 Deficiency of other specified B group vitamins: Secondary | ICD-10-CM | POA: Diagnosis not present

## 2022-10-30 DIAGNOSIS — E538 Deficiency of other specified B group vitamins: Secondary | ICD-10-CM | POA: Diagnosis not present

## 2022-10-31 DIAGNOSIS — Z1211 Encounter for screening for malignant neoplasm of colon: Secondary | ICD-10-CM | POA: Diagnosis not present

## 2022-10-31 DIAGNOSIS — E782 Mixed hyperlipidemia: Secondary | ICD-10-CM | POA: Diagnosis not present

## 2022-10-31 DIAGNOSIS — K5909 Other constipation: Secondary | ICD-10-CM | POA: Diagnosis not present

## 2022-10-31 DIAGNOSIS — Z1331 Encounter for screening for depression: Secondary | ICD-10-CM | POA: Diagnosis not present

## 2022-10-31 DIAGNOSIS — Z13820 Encounter for screening for osteoporosis: Secondary | ICD-10-CM | POA: Diagnosis not present

## 2022-10-31 DIAGNOSIS — I129 Hypertensive chronic kidney disease with stage 1 through stage 4 chronic kidney disease, or unspecified chronic kidney disease: Secondary | ICD-10-CM | POA: Diagnosis not present

## 2022-10-31 DIAGNOSIS — Z122 Encounter for screening for malignant neoplasm of respiratory organs: Secondary | ICD-10-CM | POA: Diagnosis not present

## 2022-10-31 DIAGNOSIS — I10 Essential (primary) hypertension: Secondary | ICD-10-CM | POA: Diagnosis not present

## 2022-10-31 DIAGNOSIS — E559 Vitamin D deficiency, unspecified: Secondary | ICD-10-CM | POA: Diagnosis not present

## 2022-10-31 DIAGNOSIS — Z0000 Encounter for general adult medical examination without abnormal findings: Secondary | ICD-10-CM | POA: Diagnosis not present

## 2022-10-31 DIAGNOSIS — Z1239 Encounter for other screening for malignant neoplasm of breast: Secondary | ICD-10-CM | POA: Diagnosis not present

## 2022-10-31 DIAGNOSIS — Z23 Encounter for immunization: Secondary | ICD-10-CM | POA: Diagnosis not present

## 2022-10-31 DIAGNOSIS — Z1339 Encounter for screening examination for other mental health and behavioral disorders: Secondary | ICD-10-CM | POA: Diagnosis not present

## 2022-11-10 ENCOUNTER — Other Ambulatory Visit: Payer: Self-pay | Admitting: Gerontology

## 2022-11-10 DIAGNOSIS — Z1231 Encounter for screening mammogram for malignant neoplasm of breast: Secondary | ICD-10-CM

## 2022-11-17 DIAGNOSIS — N1831 Chronic kidney disease, stage 3a: Secondary | ICD-10-CM | POA: Diagnosis not present

## 2022-11-17 DIAGNOSIS — D510 Vitamin B12 deficiency anemia due to intrinsic factor deficiency: Secondary | ICD-10-CM | POA: Diagnosis not present

## 2022-11-17 DIAGNOSIS — K2940 Chronic atrophic gastritis without bleeding: Secondary | ICD-10-CM | POA: Diagnosis not present

## 2022-11-17 DIAGNOSIS — Z832 Family history of diseases of the blood and blood-forming organs and certain disorders involving the immune mechanism: Secondary | ICD-10-CM | POA: Diagnosis not present

## 2022-11-17 DIAGNOSIS — D631 Anemia in chronic kidney disease: Secondary | ICD-10-CM | POA: Diagnosis not present

## 2022-11-17 DIAGNOSIS — K581 Irritable bowel syndrome with constipation: Secondary | ICD-10-CM | POA: Diagnosis not present

## 2022-11-17 DIAGNOSIS — E538 Deficiency of other specified B group vitamins: Secondary | ICD-10-CM | POA: Diagnosis not present

## 2022-11-18 DIAGNOSIS — R278 Other lack of coordination: Secondary | ICD-10-CM | POA: Diagnosis not present

## 2022-11-18 DIAGNOSIS — E119 Type 2 diabetes mellitus without complications: Secondary | ICD-10-CM | POA: Diagnosis not present

## 2022-11-18 DIAGNOSIS — M2012 Hallux valgus (acquired), left foot: Secondary | ICD-10-CM | POA: Diagnosis not present

## 2022-11-18 DIAGNOSIS — M2011 Hallux valgus (acquired), right foot: Secondary | ICD-10-CM | POA: Diagnosis not present

## 2022-11-25 DIAGNOSIS — I255 Ischemic cardiomyopathy: Secondary | ICD-10-CM | POA: Diagnosis not present

## 2022-11-25 DIAGNOSIS — I25118 Atherosclerotic heart disease of native coronary artery with other forms of angina pectoris: Secondary | ICD-10-CM | POA: Diagnosis not present

## 2022-11-25 DIAGNOSIS — E1169 Type 2 diabetes mellitus with other specified complication: Secondary | ICD-10-CM | POA: Diagnosis not present

## 2022-11-25 DIAGNOSIS — E782 Mixed hyperlipidemia: Secondary | ICD-10-CM | POA: Diagnosis not present

## 2022-11-25 DIAGNOSIS — I10 Essential (primary) hypertension: Secondary | ICD-10-CM | POA: Diagnosis not present

## 2022-11-25 DIAGNOSIS — I420 Dilated cardiomyopathy: Secondary | ICD-10-CM | POA: Diagnosis not present

## 2022-11-28 DIAGNOSIS — E538 Deficiency of other specified B group vitamins: Secondary | ICD-10-CM | POA: Diagnosis not present

## 2022-11-28 DIAGNOSIS — R92323 Mammographic fibroglandular density, bilateral breasts: Secondary | ICD-10-CM | POA: Diagnosis not present

## 2022-11-28 DIAGNOSIS — Z1231 Encounter for screening mammogram for malignant neoplasm of breast: Secondary | ICD-10-CM | POA: Diagnosis not present

## 2023-01-15 DIAGNOSIS — R079 Chest pain, unspecified: Secondary | ICD-10-CM | POA: Diagnosis not present

## 2023-01-15 DIAGNOSIS — E7800 Pure hypercholesterolemia, unspecified: Secondary | ICD-10-CM | POA: Diagnosis not present

## 2023-01-15 DIAGNOSIS — I25119 Atherosclerotic heart disease of native coronary artery with unspecified angina pectoris: Secondary | ICD-10-CM | POA: Diagnosis not present

## 2023-01-15 DIAGNOSIS — I255 Ischemic cardiomyopathy: Secondary | ICD-10-CM | POA: Diagnosis not present

## 2023-01-15 DIAGNOSIS — I10 Essential (primary) hypertension: Secondary | ICD-10-CM | POA: Diagnosis not present

## 2023-01-15 DIAGNOSIS — E119 Type 2 diabetes mellitus without complications: Secondary | ICD-10-CM | POA: Diagnosis not present

## 2023-01-22 DIAGNOSIS — Z09 Encounter for follow-up examination after completed treatment for conditions other than malignant neoplasm: Secondary | ICD-10-CM | POA: Diagnosis not present

## 2023-01-22 DIAGNOSIS — I25118 Atherosclerotic heart disease of native coronary artery with other forms of angina pectoris: Secondary | ICD-10-CM | POA: Diagnosis not present

## 2023-01-22 DIAGNOSIS — D631 Anemia in chronic kidney disease: Secondary | ICD-10-CM | POA: Diagnosis not present

## 2023-01-22 DIAGNOSIS — E1169 Type 2 diabetes mellitus with other specified complication: Secondary | ICD-10-CM | POA: Diagnosis not present

## 2023-01-22 DIAGNOSIS — I509 Heart failure, unspecified: Secondary | ICD-10-CM | POA: Diagnosis not present

## 2023-01-22 DIAGNOSIS — R002 Palpitations: Secondary | ICD-10-CM | POA: Diagnosis not present

## 2023-01-22 DIAGNOSIS — I130 Hypertensive heart and chronic kidney disease with heart failure and stage 1 through stage 4 chronic kidney disease, or unspecified chronic kidney disease: Secondary | ICD-10-CM | POA: Diagnosis not present

## 2023-01-22 DIAGNOSIS — E1122 Type 2 diabetes mellitus with diabetic chronic kidney disease: Secondary | ICD-10-CM | POA: Diagnosis not present

## 2023-01-22 DIAGNOSIS — I10 Essential (primary) hypertension: Secondary | ICD-10-CM | POA: Diagnosis not present

## 2023-01-22 DIAGNOSIS — N1831 Chronic kidney disease, stage 3a: Secondary | ICD-10-CM | POA: Diagnosis not present

## 2023-01-22 DIAGNOSIS — E538 Deficiency of other specified B group vitamins: Secondary | ICD-10-CM | POA: Diagnosis not present

## 2023-01-30 DIAGNOSIS — E538 Deficiency of other specified B group vitamins: Secondary | ICD-10-CM | POA: Diagnosis not present

## 2023-02-13 DIAGNOSIS — E538 Deficiency of other specified B group vitamins: Secondary | ICD-10-CM | POA: Diagnosis not present

## 2023-02-19 DIAGNOSIS — E559 Vitamin D deficiency, unspecified: Secondary | ICD-10-CM | POA: Diagnosis not present

## 2023-02-19 DIAGNOSIS — Z79899 Other long term (current) drug therapy: Secondary | ICD-10-CM | POA: Diagnosis not present

## 2023-02-19 DIAGNOSIS — E782 Mixed hyperlipidemia: Secondary | ICD-10-CM | POA: Diagnosis not present

## 2023-02-19 DIAGNOSIS — E1122 Type 2 diabetes mellitus with diabetic chronic kidney disease: Secondary | ICD-10-CM | POA: Diagnosis not present

## 2023-02-19 DIAGNOSIS — I10 Essential (primary) hypertension: Secondary | ICD-10-CM | POA: Diagnosis not present

## 2023-02-19 DIAGNOSIS — N1831 Chronic kidney disease, stage 3a: Secondary | ICD-10-CM | POA: Diagnosis not present

## 2023-02-19 DIAGNOSIS — D631 Anemia in chronic kidney disease: Secondary | ICD-10-CM | POA: Diagnosis not present

## 2023-02-23 DIAGNOSIS — E559 Vitamin D deficiency, unspecified: Secondary | ICD-10-CM | POA: Diagnosis not present

## 2023-02-23 DIAGNOSIS — I25118 Atherosclerotic heart disease of native coronary artery with other forms of angina pectoris: Secondary | ICD-10-CM | POA: Diagnosis not present

## 2023-02-23 DIAGNOSIS — N1831 Chronic kidney disease, stage 3a: Secondary | ICD-10-CM | POA: Diagnosis not present

## 2023-02-23 DIAGNOSIS — E1122 Type 2 diabetes mellitus with diabetic chronic kidney disease: Secondary | ICD-10-CM | POA: Diagnosis not present

## 2023-02-23 DIAGNOSIS — D631 Anemia in chronic kidney disease: Secondary | ICD-10-CM | POA: Diagnosis not present

## 2023-02-23 DIAGNOSIS — E538 Deficiency of other specified B group vitamins: Secondary | ICD-10-CM | POA: Diagnosis not present

## 2023-02-23 DIAGNOSIS — I129 Hypertensive chronic kidney disease with stage 1 through stage 4 chronic kidney disease, or unspecified chronic kidney disease: Secondary | ICD-10-CM | POA: Diagnosis not present

## 2023-02-23 DIAGNOSIS — K5909 Other constipation: Secondary | ICD-10-CM | POA: Diagnosis not present

## 2023-02-24 DIAGNOSIS — I10 Essential (primary) hypertension: Secondary | ICD-10-CM | POA: Diagnosis not present

## 2023-02-24 DIAGNOSIS — I255 Ischemic cardiomyopathy: Secondary | ICD-10-CM | POA: Diagnosis not present

## 2023-02-24 DIAGNOSIS — I420 Dilated cardiomyopathy: Secondary | ICD-10-CM | POA: Diagnosis not present

## 2023-02-24 DIAGNOSIS — I25708 Atherosclerosis of coronary artery bypass graft(s), unspecified, with other forms of angina pectoris: Secondary | ICD-10-CM | POA: Diagnosis not present

## 2023-02-24 DIAGNOSIS — E782 Mixed hyperlipidemia: Secondary | ICD-10-CM | POA: Diagnosis not present

## 2023-02-24 DIAGNOSIS — E1169 Type 2 diabetes mellitus with other specified complication: Secondary | ICD-10-CM | POA: Diagnosis not present

## 2023-02-24 DIAGNOSIS — I25118 Atherosclerotic heart disease of native coronary artery with other forms of angina pectoris: Secondary | ICD-10-CM | POA: Diagnosis not present

## 2023-03-05 DIAGNOSIS — I255 Ischemic cardiomyopathy: Secondary | ICD-10-CM | POA: Diagnosis not present

## 2023-03-05 DIAGNOSIS — I10 Essential (primary) hypertension: Secondary | ICD-10-CM | POA: Diagnosis not present

## 2023-03-05 DIAGNOSIS — E119 Type 2 diabetes mellitus without complications: Secondary | ICD-10-CM | POA: Diagnosis not present

## 2023-03-05 DIAGNOSIS — I25119 Atherosclerotic heart disease of native coronary artery with unspecified angina pectoris: Secondary | ICD-10-CM | POA: Diagnosis not present

## 2023-03-05 DIAGNOSIS — E7800 Pure hypercholesterolemia, unspecified: Secondary | ICD-10-CM | POA: Diagnosis not present

## 2023-03-05 DIAGNOSIS — R079 Chest pain, unspecified: Secondary | ICD-10-CM | POA: Diagnosis not present

## 2023-03-11 DIAGNOSIS — R278 Other lack of coordination: Secondary | ICD-10-CM | POA: Diagnosis not present

## 2023-03-12 DIAGNOSIS — I255 Ischemic cardiomyopathy: Secondary | ICD-10-CM | POA: Diagnosis not present

## 2023-03-12 DIAGNOSIS — I25119 Atherosclerotic heart disease of native coronary artery with unspecified angina pectoris: Secondary | ICD-10-CM | POA: Diagnosis not present

## 2023-03-12 DIAGNOSIS — R0602 Shortness of breath: Secondary | ICD-10-CM | POA: Diagnosis not present

## 2023-03-12 DIAGNOSIS — R079 Chest pain, unspecified: Secondary | ICD-10-CM | POA: Diagnosis not present

## 2023-03-20 DIAGNOSIS — E538 Deficiency of other specified B group vitamins: Secondary | ICD-10-CM | POA: Diagnosis not present

## 2023-03-23 DIAGNOSIS — M65341 Trigger finger, right ring finger: Secondary | ICD-10-CM | POA: Diagnosis not present

## 2023-03-23 DIAGNOSIS — G5603 Carpal tunnel syndrome, bilateral upper limbs: Secondary | ICD-10-CM | POA: Diagnosis not present

## 2023-04-07 DIAGNOSIS — I25119 Atherosclerotic heart disease of native coronary artery with unspecified angina pectoris: Secondary | ICD-10-CM | POA: Diagnosis not present

## 2023-04-07 DIAGNOSIS — R079 Chest pain, unspecified: Secondary | ICD-10-CM | POA: Diagnosis not present

## 2023-04-07 DIAGNOSIS — E119 Type 2 diabetes mellitus without complications: Secondary | ICD-10-CM | POA: Diagnosis not present

## 2023-04-07 DIAGNOSIS — I10 Essential (primary) hypertension: Secondary | ICD-10-CM | POA: Diagnosis not present

## 2023-04-07 DIAGNOSIS — E7800 Pure hypercholesterolemia, unspecified: Secondary | ICD-10-CM | POA: Diagnosis not present

## 2023-04-07 DIAGNOSIS — I255 Ischemic cardiomyopathy: Secondary | ICD-10-CM | POA: Diagnosis not present

## 2023-04-09 DIAGNOSIS — I110 Hypertensive heart disease with heart failure: Secondary | ICD-10-CM | POA: Diagnosis not present

## 2023-04-09 DIAGNOSIS — I255 Ischemic cardiomyopathy: Secondary | ICD-10-CM | POA: Diagnosis not present

## 2023-04-09 DIAGNOSIS — I5022 Chronic systolic (congestive) heart failure: Secondary | ICD-10-CM | POA: Diagnosis not present

## 2023-04-09 DIAGNOSIS — I420 Dilated cardiomyopathy: Secondary | ICD-10-CM | POA: Diagnosis not present

## 2023-04-14 DIAGNOSIS — K5900 Constipation, unspecified: Secondary | ICD-10-CM | POA: Diagnosis not present

## 2023-04-14 DIAGNOSIS — K581 Irritable bowel syndrome with constipation: Secondary | ICD-10-CM | POA: Diagnosis not present

## 2023-04-14 DIAGNOSIS — D509 Iron deficiency anemia, unspecified: Secondary | ICD-10-CM | POA: Diagnosis not present

## 2023-04-20 DIAGNOSIS — G5603 Carpal tunnel syndrome, bilateral upper limbs: Secondary | ICD-10-CM | POA: Diagnosis not present

## 2023-04-20 DIAGNOSIS — E538 Deficiency of other specified B group vitamins: Secondary | ICD-10-CM | POA: Diagnosis not present

## 2023-04-20 DIAGNOSIS — M65341 Trigger finger, right ring finger: Secondary | ICD-10-CM | POA: Diagnosis not present

## 2023-05-01 DIAGNOSIS — R002 Palpitations: Secondary | ICD-10-CM | POA: Diagnosis not present

## 2023-05-01 DIAGNOSIS — I25118 Atherosclerotic heart disease of native coronary artery with other forms of angina pectoris: Secondary | ICD-10-CM | POA: Diagnosis not present

## 2023-05-01 DIAGNOSIS — I509 Heart failure, unspecified: Secondary | ICD-10-CM | POA: Diagnosis not present

## 2023-05-01 DIAGNOSIS — I10 Essential (primary) hypertension: Secondary | ICD-10-CM | POA: Diagnosis not present

## 2023-05-01 DIAGNOSIS — I255 Ischemic cardiomyopathy: Secondary | ICD-10-CM | POA: Diagnosis not present

## 2023-05-01 DIAGNOSIS — I420 Dilated cardiomyopathy: Secondary | ICD-10-CM | POA: Diagnosis not present

## 2023-05-22 DIAGNOSIS — E1122 Type 2 diabetes mellitus with diabetic chronic kidney disease: Secondary | ICD-10-CM | POA: Diagnosis not present

## 2023-05-22 DIAGNOSIS — N1831 Chronic kidney disease, stage 3a: Secondary | ICD-10-CM | POA: Diagnosis not present

## 2023-05-25 DIAGNOSIS — E538 Deficiency of other specified B group vitamins: Secondary | ICD-10-CM | POA: Diagnosis not present

## 2023-05-29 DIAGNOSIS — E538 Deficiency of other specified B group vitamins: Secondary | ICD-10-CM | POA: Diagnosis not present

## 2023-05-29 DIAGNOSIS — K5909 Other constipation: Secondary | ICD-10-CM | POA: Diagnosis not present

## 2023-05-29 DIAGNOSIS — I129 Hypertensive chronic kidney disease with stage 1 through stage 4 chronic kidney disease, or unspecified chronic kidney disease: Secondary | ICD-10-CM | POA: Diagnosis not present

## 2023-05-29 DIAGNOSIS — E1122 Type 2 diabetes mellitus with diabetic chronic kidney disease: Secondary | ICD-10-CM | POA: Diagnosis not present

## 2023-05-29 DIAGNOSIS — E559 Vitamin D deficiency, unspecified: Secondary | ICD-10-CM | POA: Diagnosis not present

## 2023-05-29 DIAGNOSIS — I25118 Atherosclerotic heart disease of native coronary artery with other forms of angina pectoris: Secondary | ICD-10-CM | POA: Diagnosis not present

## 2023-05-29 DIAGNOSIS — I255 Ischemic cardiomyopathy: Secondary | ICD-10-CM | POA: Diagnosis not present

## 2023-05-29 DIAGNOSIS — N1831 Chronic kidney disease, stage 3a: Secondary | ICD-10-CM | POA: Diagnosis not present

## 2023-05-29 DIAGNOSIS — D631 Anemia in chronic kidney disease: Secondary | ICD-10-CM | POA: Diagnosis not present

## 2023-06-02 DIAGNOSIS — I10 Essential (primary) hypertension: Secondary | ICD-10-CM | POA: Diagnosis not present

## 2023-06-02 DIAGNOSIS — I25118 Atherosclerotic heart disease of native coronary artery with other forms of angina pectoris: Secondary | ICD-10-CM | POA: Diagnosis not present

## 2023-06-02 DIAGNOSIS — E782 Mixed hyperlipidemia: Secondary | ICD-10-CM | POA: Diagnosis not present

## 2023-06-02 DIAGNOSIS — E1169 Type 2 diabetes mellitus with other specified complication: Secondary | ICD-10-CM | POA: Diagnosis not present

## 2023-06-02 DIAGNOSIS — I509 Heart failure, unspecified: Secondary | ICD-10-CM | POA: Diagnosis not present

## 2023-10-27 IMAGING — MG MM DIGITAL SCREENING BILAT W/ TOMO AND CAD
8 series · 8 of 24 positions shown · non-contrast
Comparison: Previous exam(s).

CLINICAL DATA: Screening.

EXAM:
DIGITAL SCREENING BILATERAL MAMMOGRAM WITH TOMOSYNTHESIS AND CAD
TECHNIQUE: Bilateral screening digital craniocaudal and mediolateral oblique
mammograms were obtained. Bilateral screening digital breast
tomosynthesis was performed. The images were evaluated with
computer-aided detection.

[L MLO synth-2D]
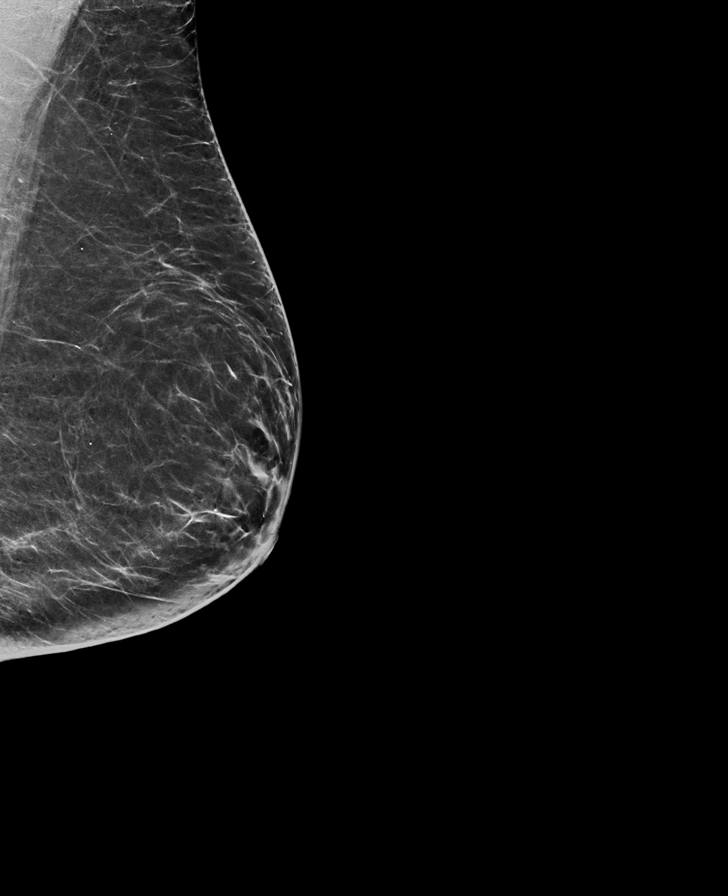

[L CC synth-2D]
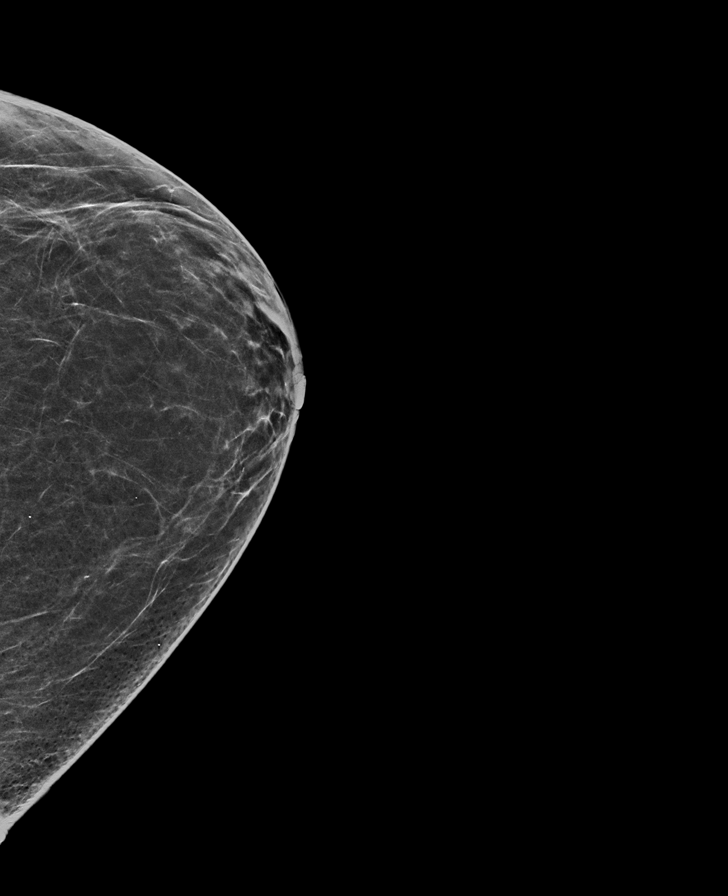

[R CC synth-2D]
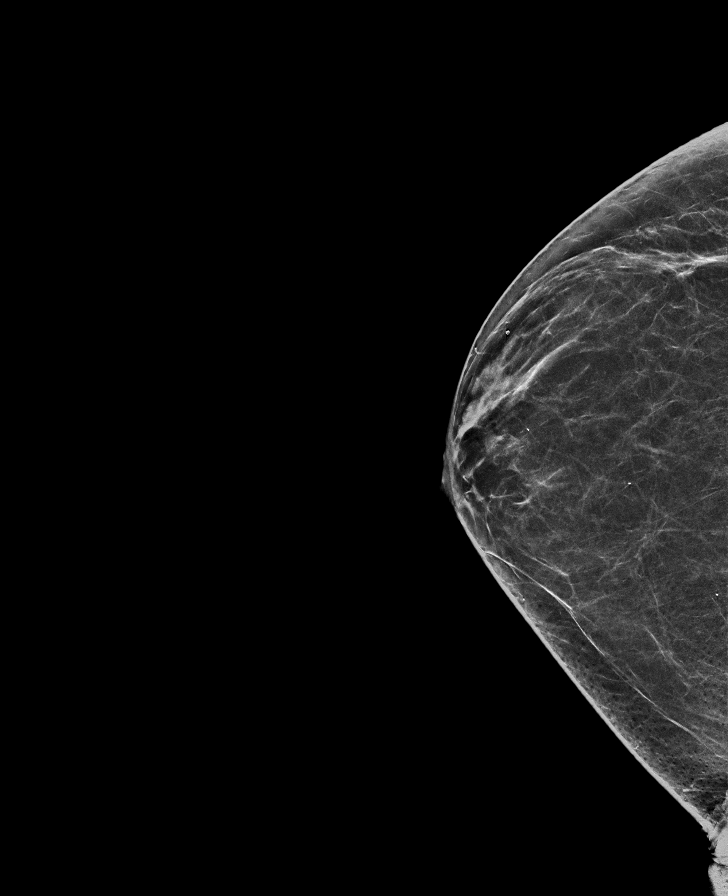

[R MLO synth-2D]
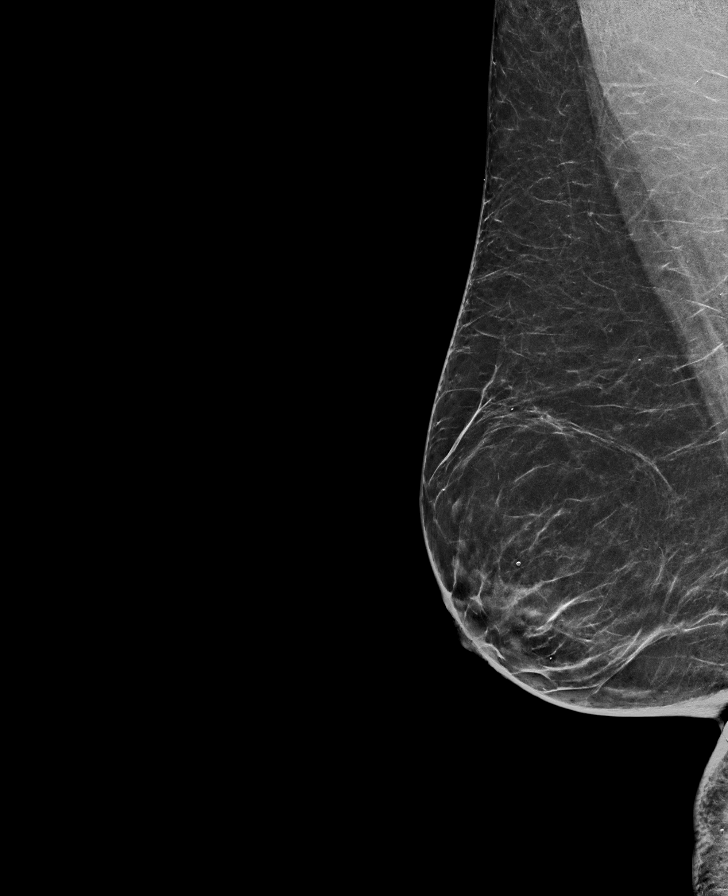

[L CC tomo · tomo slice 32/63.0]
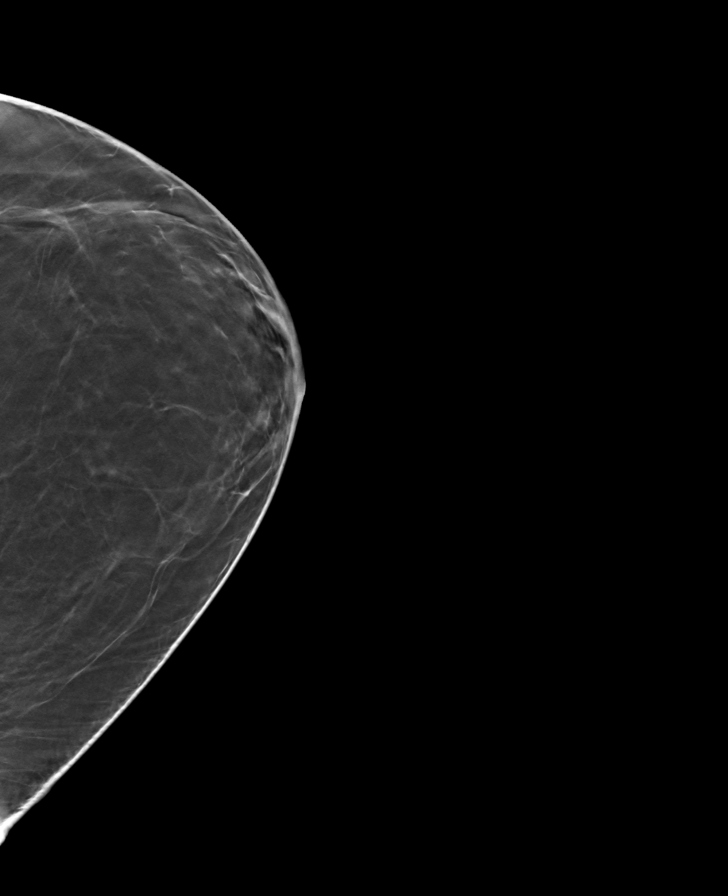

[R CC tomo · tomo slice 30/59.0]
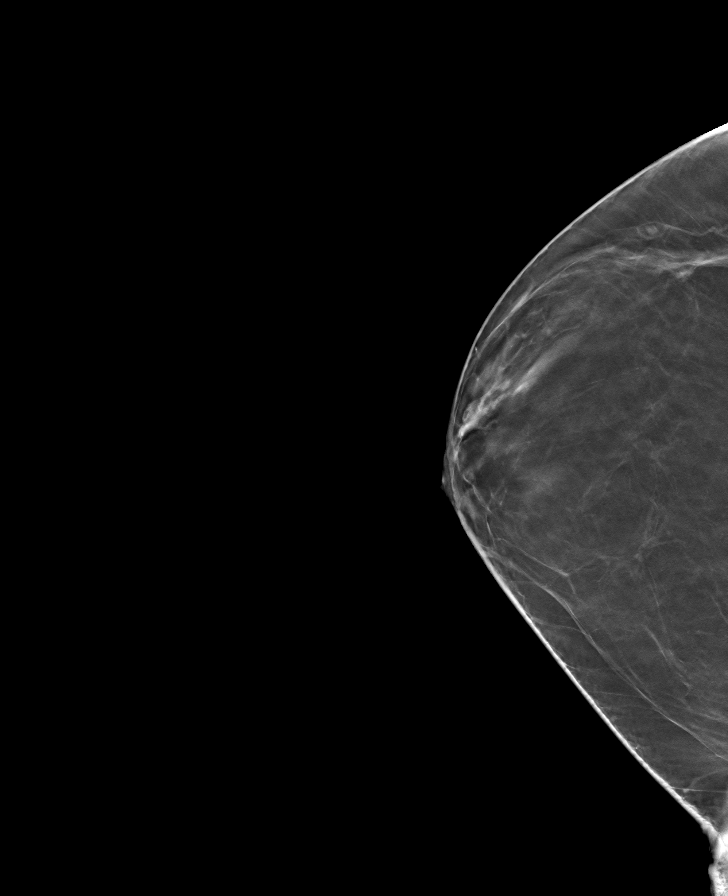

[L MLO tomo · tomo slice 35/70.0]
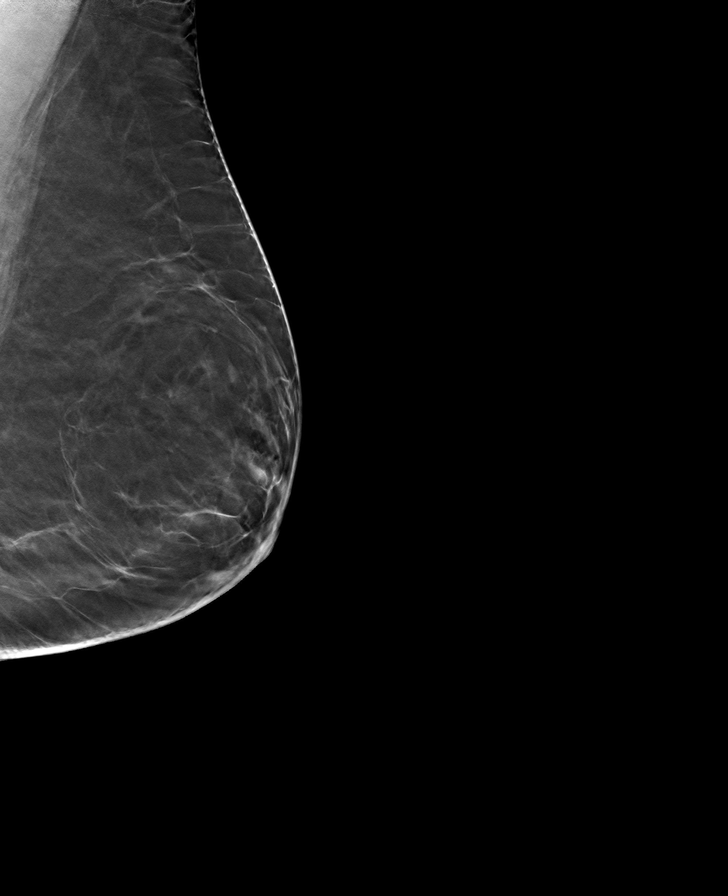

[R MLO tomo · tomo slice 35/70.0]
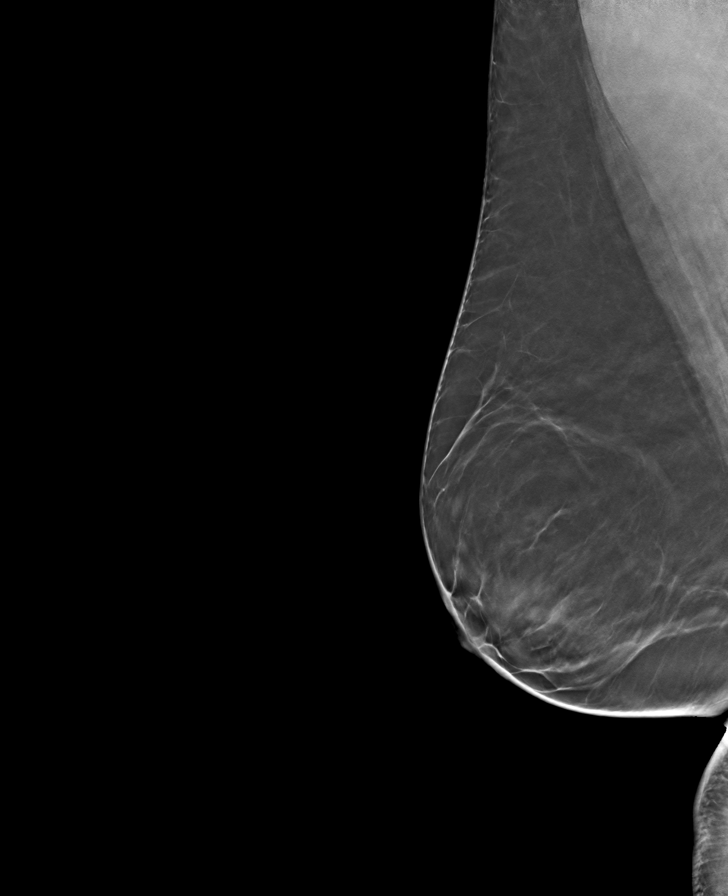

[8 of 24 positions shown; findings below may reference images not displayed]

ACR Breast Density Category b: There are scattered areas of
fibroglandular density.
FINDINGS: There are no findings suspicious for malignancy.
IMPRESSION: No mammographic evidence of malignancy. A result letter of this
screening mammogram will be mailed directly to the patient.

RECOMMENDATION:
Screening mammogram in one year. (Code:51-O-LD2)

BI-RADS CATEGORY  1: Negative.

## 2024-08-22 ENCOUNTER — Other Ambulatory Visit: Payer: Self-pay

## 2024-08-22 ENCOUNTER — Emergency Department
Admission: EM | Admit: 2024-08-22 | Discharge: 2024-08-22 | Disposition: A | Discharge status: Home / Self Care | Attending: Emergency Medicine | Admitting: Emergency Medicine

## 2024-08-22 DIAGNOSIS — Z113 Encounter for screening for infections with a predominantly sexual mode of transmission: Secondary | ICD-10-CM | POA: Insufficient documentation

## 2024-08-22 DIAGNOSIS — T7411XA Adult physical abuse, confirmed, initial encounter: Secondary | ICD-10-CM | POA: Diagnosis present

## 2024-08-22 DIAGNOSIS — Z79899 Other long term (current) drug therapy: Secondary | ICD-10-CM | POA: Insufficient documentation

## 2024-08-22 LAB — URINALYSIS, ROUTINE W REFLEX MICROSCOPIC
Bacteria, UA: NONE SEEN
Bilirubin Urine: NEGATIVE
Glucose, UA: >=500 mg/dL — AB
Ketones, ur: NEGATIVE mg/dL
Leukocytes,Ua: NEGATIVE
Nitrite: NEGATIVE
Protein, ur: NEGATIVE mg/dL
Specific Gravity, Urine: 1.028 (ref 1.005–1.030)
pH: 5 (ref 5.0–8.0)

## 2024-08-22 LAB — URINE DRUG SCREEN, QUALITATIVE (ARMC ONLY)
Amphetamines, Ur Screen: NOT DETECTED
Barbiturates, Ur Screen: NOT DETECTED
Benzodiazepine, Ur Scrn: NOT DETECTED
Cannabinoid 50 Ng, Ur ~~LOC~~: NOT DETECTED
Cocaine Metabolite,Ur ~~LOC~~: NOT DETECTED
MDMA (Ecstasy)Ur Screen: NOT DETECTED
Methadone Scn, Ur: NOT DETECTED
Opiate, Ur Screen: NOT DETECTED
Phencyclidine (PCP) Ur S: NOT DETECTED
Tricyclic, Ur Screen: NOT DETECTED

## 2024-08-22 LAB — WET PREP, GENITAL
Clue Cells Wet Prep HPF POC: NONE SEEN
Sperm: NONE SEEN
Trich, Wet Prep: NONE SEEN
WBC, Wet Prep HPF POC: <10 (ref <10)
Yeast Wet Prep HPF POC: NONE SEEN

## 2024-08-22 LAB — CHLAMYDIA/NGC RT PCR (ARMC ONLY)
Chlamydia Tr: NOT DETECTED
N gonorrhoeae: NOT DETECTED

## 2024-08-22 LAB — PREGNANCY, URINE: Preg Test, Ur: NEGATIVE

## 2024-08-22 NOTE — ED Notes (Signed)
 Phone given the pt so that the SANE RN Franne can speak with the pt.

## 2024-08-22 NOTE — ED Notes (Signed)
 SANE RN paged by Diplomatic Services operational officer
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
